# Patient Record
Sex: Female | Born: 1960 | Race: White | Hispanic: No | Marital: Married | Smoking: Never smoker
Health system: Southern US, Community
[De-identification: ages and names within clinical notes are randomized; demographics above are authoritative.]

## PROBLEM LIST (undated history)

## (undated) DIAGNOSIS — B009 Herpesviral infection, unspecified: Secondary | ICD-10-CM

## (undated) DIAGNOSIS — R51 Headache: Secondary | ICD-10-CM

## (undated) HISTORY — PX: TONSILLECTOMY: SUR1361

## (undated) HISTORY — PX: SALPINGECTOMY: SHX328

## (undated) HISTORY — DX: Headache: R51

## (undated) HISTORY — DX: Herpesviral infection, unspecified: B00.9

---

## 2001-08-16 ENCOUNTER — Other Ambulatory Visit: Admission: RE | Admit: 2001-08-16 | Discharge: 2001-08-16 | Payer: Self-pay | Admitting: Obstetrics and Gynecology

## 2002-08-28 ENCOUNTER — Encounter: Admission: RE | Admit: 2002-08-28 | Discharge: 2002-08-28 | Payer: Self-pay | Admitting: Obstetrics and Gynecology

## 2002-08-28 ENCOUNTER — Encounter: Payer: Self-pay | Admitting: Obstetrics and Gynecology

## 2002-09-25 ENCOUNTER — Other Ambulatory Visit: Admission: RE | Admit: 2002-09-25 | Discharge: 2002-09-25 | Payer: Self-pay | Admitting: Obstetrics and Gynecology

## 2003-10-02 ENCOUNTER — Other Ambulatory Visit: Admission: RE | Admit: 2003-10-02 | Discharge: 2003-10-02 | Payer: Self-pay | Admitting: Obstetrics and Gynecology

## 2004-02-06 ENCOUNTER — Encounter: Admission: RE | Admit: 2004-02-06 | Discharge: 2004-02-06 | Payer: Self-pay | Admitting: Obstetrics and Gynecology

## 2005-03-25 ENCOUNTER — Encounter: Admission: RE | Admit: 2005-03-25 | Discharge: 2005-03-25 | Payer: Self-pay | Admitting: Obstetrics and Gynecology

## 2006-04-21 ENCOUNTER — Ambulatory Visit: Payer: Self-pay | Admitting: Internal Medicine

## 2006-06-07 HISTORY — PX: ABDOMINAL SURGERY: SHX537

## 2006-12-12 ENCOUNTER — Encounter: Payer: Self-pay | Admitting: Internal Medicine

## 2007-01-07 ENCOUNTER — Encounter: Payer: Self-pay | Admitting: Internal Medicine

## 2007-01-24 ENCOUNTER — Ambulatory Visit: Payer: Self-pay | Admitting: Internal Medicine

## 2007-01-24 DIAGNOSIS — M545 Low back pain, unspecified: Secondary | ICD-10-CM | POA: Insufficient documentation

## 2007-01-24 DIAGNOSIS — T8089XA Other complications following infusion, transfusion and therapeutic injection, initial encounter: Secondary | ICD-10-CM | POA: Insufficient documentation

## 2007-01-24 DIAGNOSIS — A4189 Other specified sepsis: Secondary | ICD-10-CM | POA: Insufficient documentation

## 2007-03-09 ENCOUNTER — Encounter: Admission: RE | Admit: 2007-03-09 | Discharge: 2007-03-09 | Payer: Self-pay | Admitting: Internal Medicine

## 2007-03-09 ENCOUNTER — Encounter: Payer: Self-pay | Admitting: Internal Medicine

## 2007-03-09 ENCOUNTER — Telehealth: Payer: Self-pay | Admitting: Internal Medicine

## 2007-03-10 ENCOUNTER — Ambulatory Visit: Payer: Self-pay | Admitting: Internal Medicine

## 2007-03-10 ENCOUNTER — Encounter (INDEPENDENT_AMBULATORY_CARE_PROVIDER_SITE_OTHER): Payer: Self-pay | Admitting: *Deleted

## 2007-03-10 DIAGNOSIS — R109 Unspecified abdominal pain: Secondary | ICD-10-CM | POA: Insufficient documentation

## 2007-03-10 DIAGNOSIS — L02219 Cutaneous abscess of trunk, unspecified: Secondary | ICD-10-CM | POA: Insufficient documentation

## 2007-03-10 DIAGNOSIS — L03319 Cellulitis of trunk, unspecified: Secondary | ICD-10-CM

## 2007-03-10 LAB — CONVERTED CEMR LAB
Basophils Absolute: 0.1 10*3/uL (ref 0.0–0.1)
Basophils Relative: 2.1 % — ABNORMAL HIGH (ref 0.0–1.0)
Bilirubin Urine: NEGATIVE
Eosinophils Absolute: 0.1 10*3/uL (ref 0.0–0.6)
Eosinophils Relative: 2 % (ref 0.0–5.0)
HCT: 36.3 % (ref 36.0–46.0)
Hemoglobin, Urine: NEGATIVE
Hemoglobin: 12.4 g/dL (ref 12.0–15.0)
Ketones, ur: NEGATIVE mg/dL
Leukocytes, UA: NEGATIVE
Lymphocytes Relative: 22.5 % (ref 12.0–46.0)
MCHC: 34.3 g/dL (ref 30.0–36.0)
MCV: 90.9 fL (ref 78.0–100.0)
Monocytes Absolute: 0.4 10*3/uL (ref 0.2–0.7)
Monocytes Relative: 6.1 % (ref 3.0–11.0)
Neutro Abs: 4.6 10*3/uL (ref 1.4–7.7)
Neutrophils Relative %: 67.3 % (ref 43.0–77.0)
Nitrite: NEGATIVE
Platelets: 249 10*3/uL (ref 150–400)
RBC: 3.99 M/uL (ref 3.87–5.11)
RDW: 14.2 % (ref 11.5–14.6)
Specific Gravity, Urine: >=1.03 (ref 1.000–1.03)
Total Protein, Urine: NEGATIVE mg/dL
Urine Glucose: NEGATIVE mg/dL
Urobilinogen, UA: 0.2 (ref 0.0–1.0)
WBC: 6.7 10*3/uL (ref 4.5–10.5)
pH: 5.5 (ref 5.0–8.0)

## 2007-05-01 ENCOUNTER — Telehealth: Payer: Self-pay | Admitting: Internal Medicine

## 2007-05-02 ENCOUNTER — Ambulatory Visit: Payer: Self-pay | Admitting: Internal Medicine

## 2007-05-08 ENCOUNTER — Encounter (INDEPENDENT_AMBULATORY_CARE_PROVIDER_SITE_OTHER): Payer: Self-pay | Admitting: *Deleted

## 2007-05-08 LAB — CONVERTED CEMR LAB
T3, Free: 2.5 pg/mL (ref 2.3–4.2)
TSH: 3.98 microintl units/mL (ref 0.35–5.50)

## 2007-05-09 ENCOUNTER — Encounter (INDEPENDENT_AMBULATORY_CARE_PROVIDER_SITE_OTHER): Payer: Self-pay | Admitting: *Deleted

## 2007-05-11 ENCOUNTER — Telehealth (INDEPENDENT_AMBULATORY_CARE_PROVIDER_SITE_OTHER): Payer: Self-pay | Admitting: *Deleted

## 2007-10-02 ENCOUNTER — Telehealth (INDEPENDENT_AMBULATORY_CARE_PROVIDER_SITE_OTHER): Payer: Self-pay | Admitting: *Deleted

## 2007-10-03 ENCOUNTER — Ambulatory Visit: Payer: Self-pay | Admitting: Internal Medicine

## 2007-10-03 DIAGNOSIS — F329 Major depressive disorder, single episode, unspecified: Secondary | ICD-10-CM | POA: Insufficient documentation

## 2007-10-03 DIAGNOSIS — F3289 Other specified depressive episodes: Secondary | ICD-10-CM | POA: Insufficient documentation

## 2007-10-09 LAB — CONVERTED CEMR LAB
Free T4: 0.8 ng/dL (ref 0.6–1.6)
TSH: 2.65 microintl units/mL (ref 0.35–5.50)

## 2007-10-24 ENCOUNTER — Telehealth (INDEPENDENT_AMBULATORY_CARE_PROVIDER_SITE_OTHER): Payer: Self-pay | Admitting: *Deleted

## 2007-10-24 ENCOUNTER — Encounter: Payer: Self-pay | Admitting: Internal Medicine

## 2007-12-13 ENCOUNTER — Encounter: Payer: Self-pay | Admitting: Internal Medicine

## 2009-07-15 ENCOUNTER — Ambulatory Visit: Payer: Self-pay | Admitting: Sports Medicine

## 2009-07-15 DIAGNOSIS — M25559 Pain in unspecified hip: Secondary | ICD-10-CM | POA: Insufficient documentation

## 2009-07-15 DIAGNOSIS — M21869 Other specified acquired deformities of unspecified lower leg: Secondary | ICD-10-CM | POA: Insufficient documentation

## 2009-07-15 DIAGNOSIS — M7122 Synovial cyst of popliteal space [Baker], left knee: Secondary | ICD-10-CM | POA: Insufficient documentation

## 2009-09-14 IMAGING — CR DG CHEST 2V
2 series · 2 of 2 positions shown · non-contrast
Comparison: none

CLINICAL DATA: Pneumonia

Chest 2 view:
No previous available for comparison. The heart size and mediastinal contours
are within normal limits.  Both lungs are clear.  The visualized skeletal
structures are unremarkable.

[view not recorded (1 of 2)]
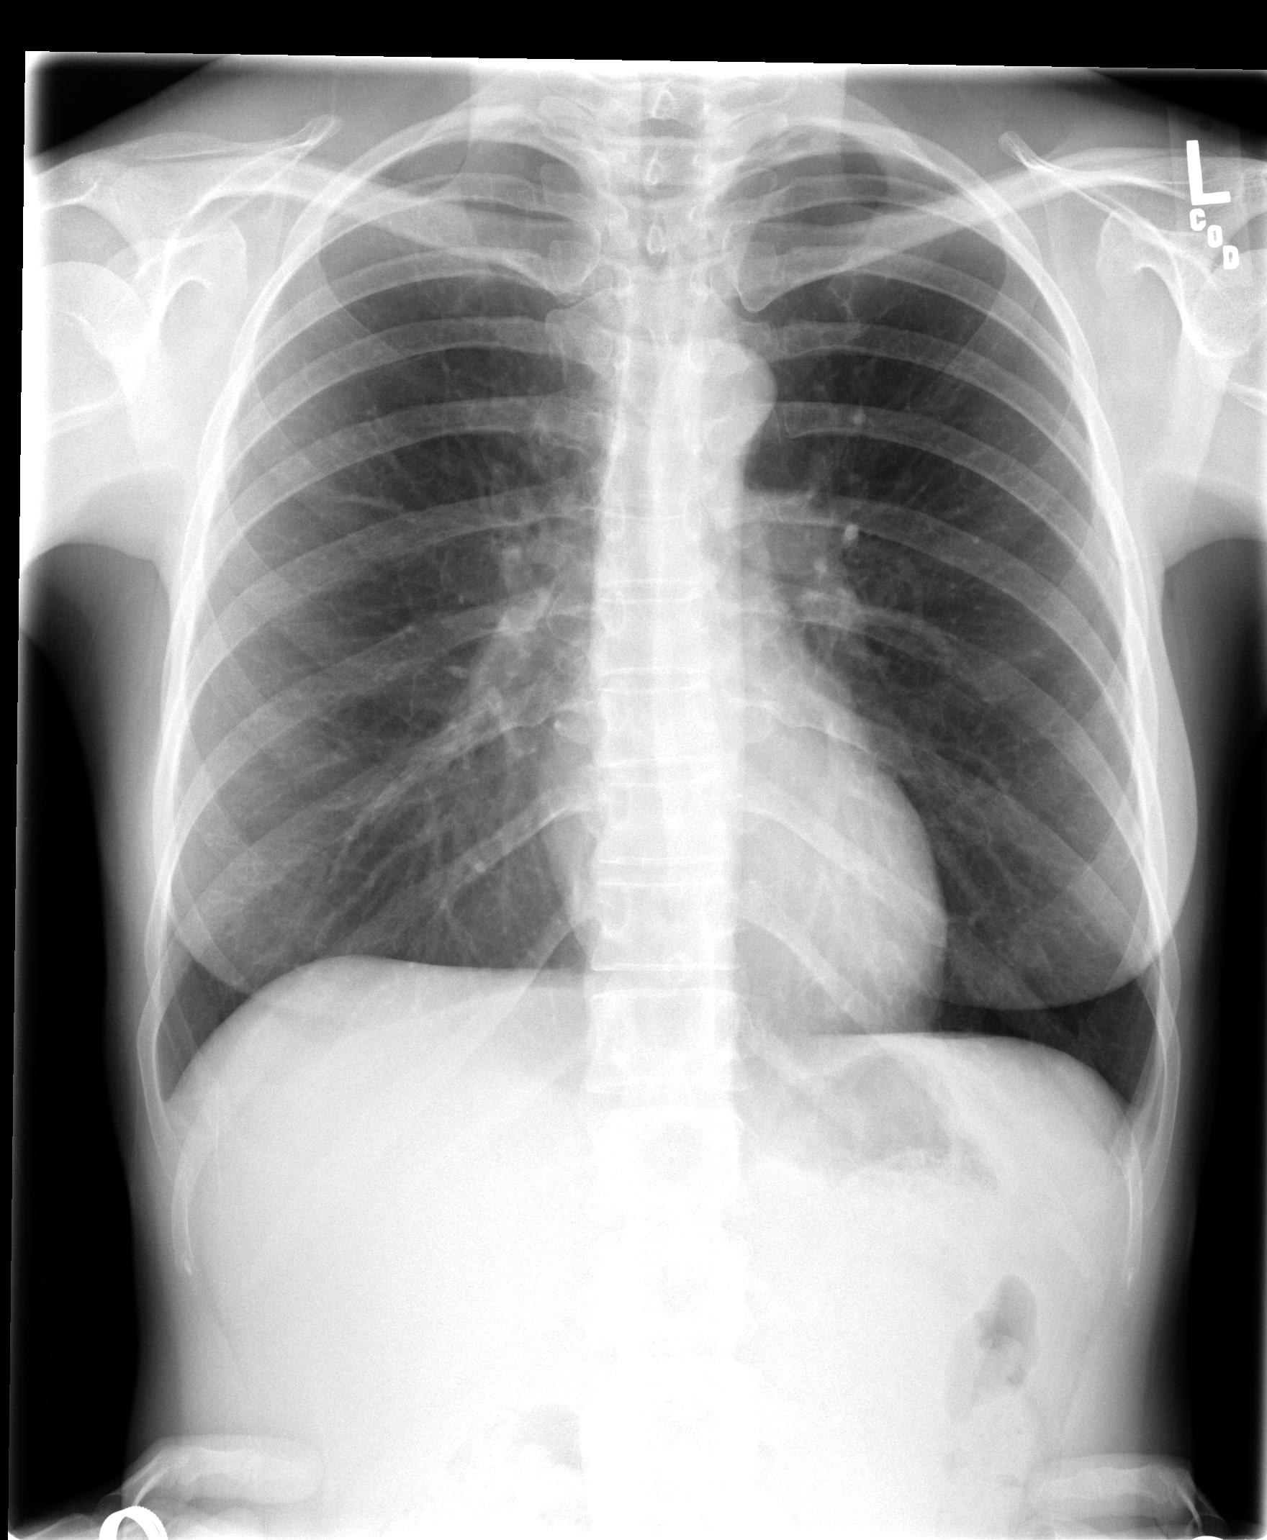

[view not recorded (2 of 2)]
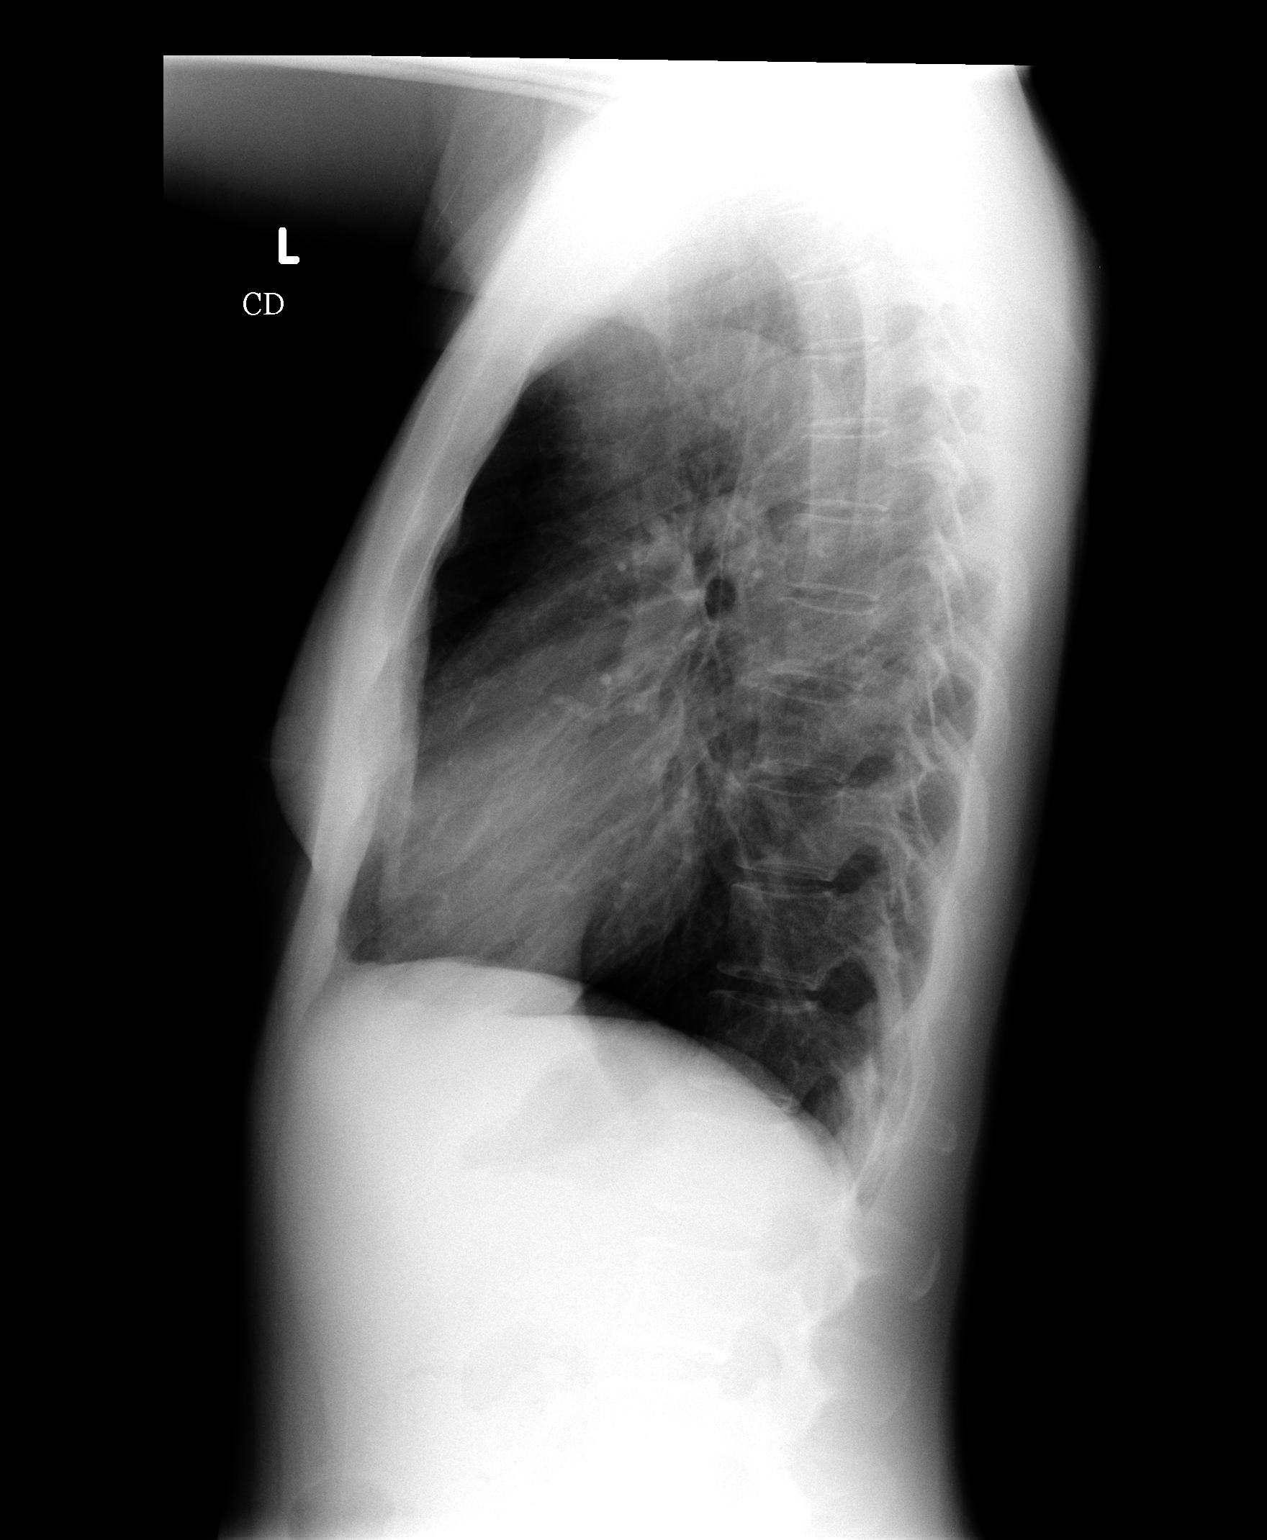

[2 of 2 positions shown; findings below may reference images not displayed]

IMPRESSION: 1. No active cardiopulmonary disease.

## 2009-09-14 IMAGING — CT CT ABDOMEN W/ CM
2 of 3 series · 16 of 46 positions shown, 18 images · IV contrast (DELAYED & DELAYED)
Comparison: None.

CLINICAL DATA: 46-year-old, periumbilical abdominal pain and swelling for 3 days.  History of septicemia, peritonitis, multiple abscesses in January 2007, and fallopian tubes resected. 
 ABDOMEN CT WITH CONTRAST:
TECHNIQUE: Multidetector CT imaging of the abdomen was performed following the standard protocol during bolus administration of intravenous contrast.
 Contrast:  100 cc Omnipaque 300
TECHNIQUE: Multidetector CT imaging of the pelvis was performed following the standard protocol during bolus administration of intravenous contrast.

[Series 2: delayed abd · axial · 0.62mm/px · z∈[-237,-27]mm · 13 of 50 slices shown, 15 images]
[im 4/50  soft-tissue]
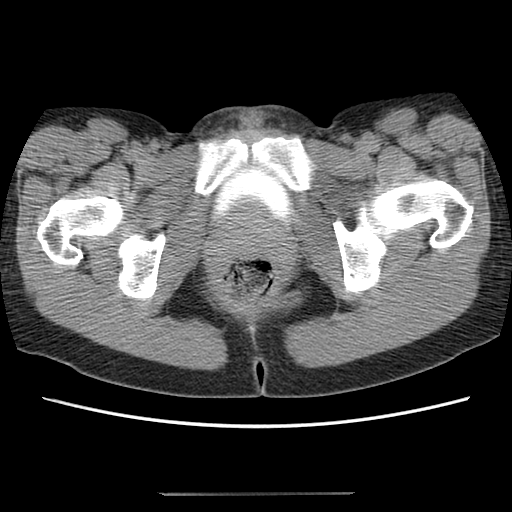
[im 4/50  bone]
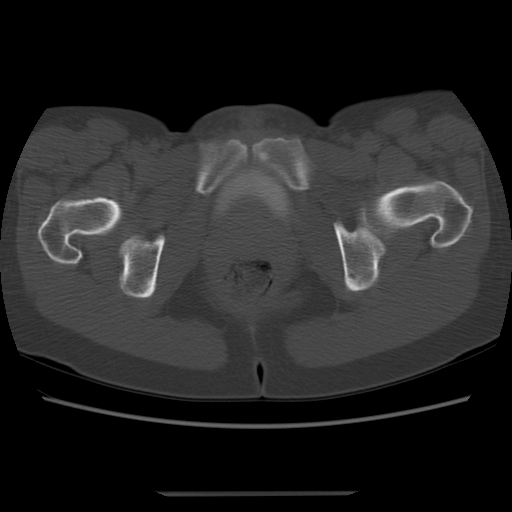
[im 7/50  soft-tissue]
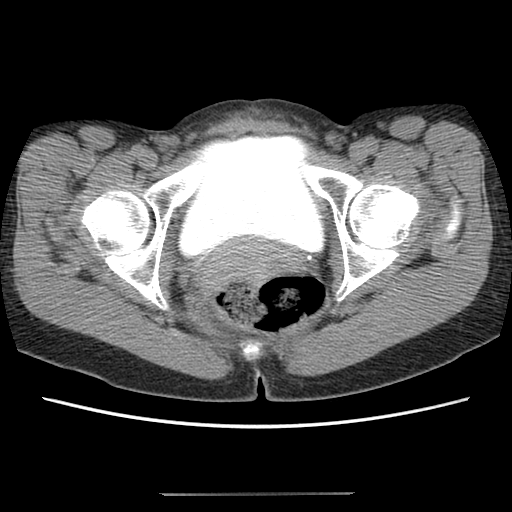
[im 10/50  soft-tissue]
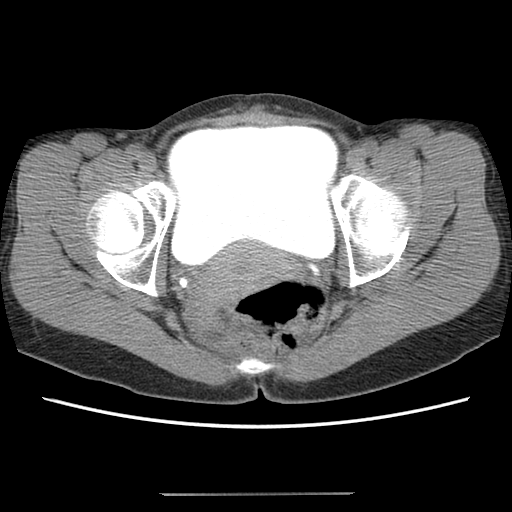
[im 15/50  soft-tissue]
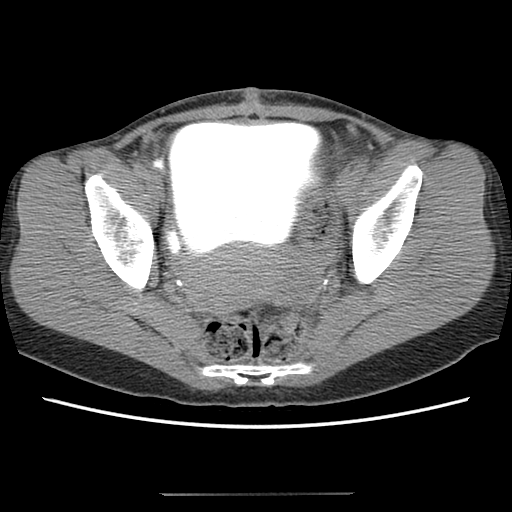
[im 18/50  soft-tissue]
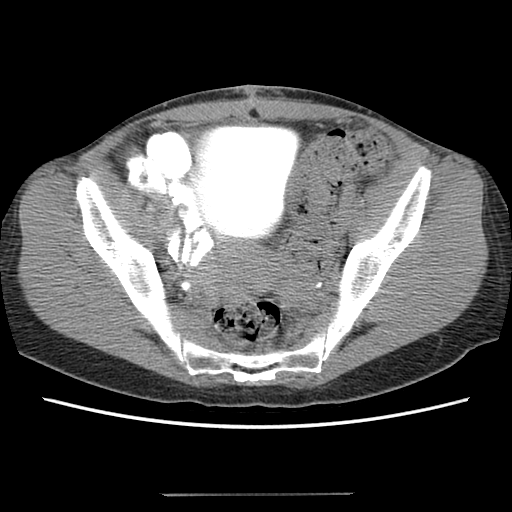
[im 21/50  soft-tissue]
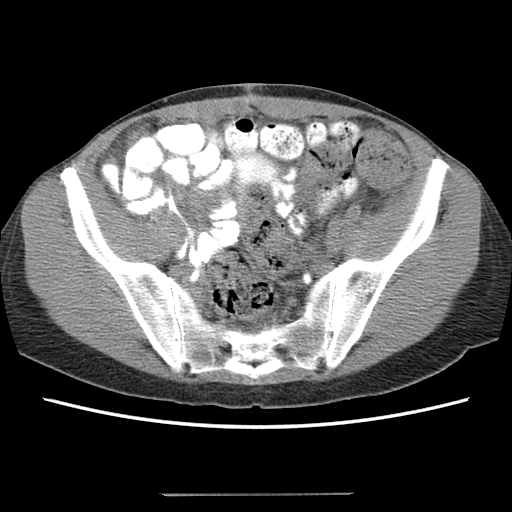
[im 26/50  soft-tissue]
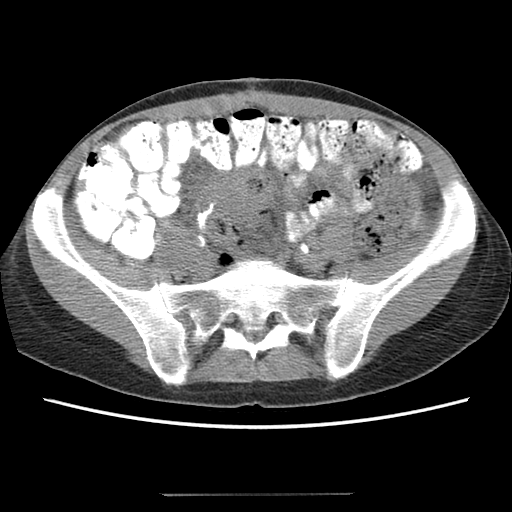
[im 29/50  soft-tissue]
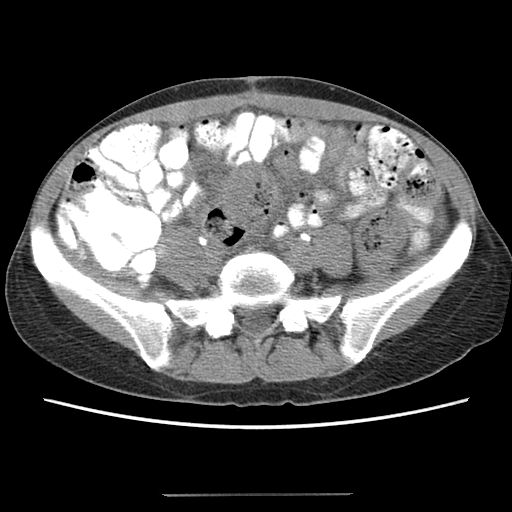
[im 32/50  soft-tissue]
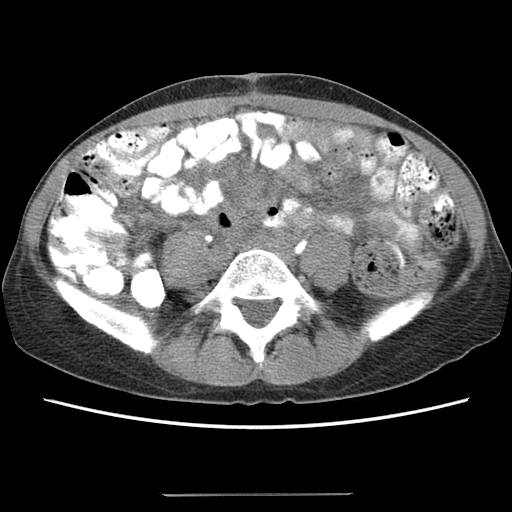
[im 32/50  bone]
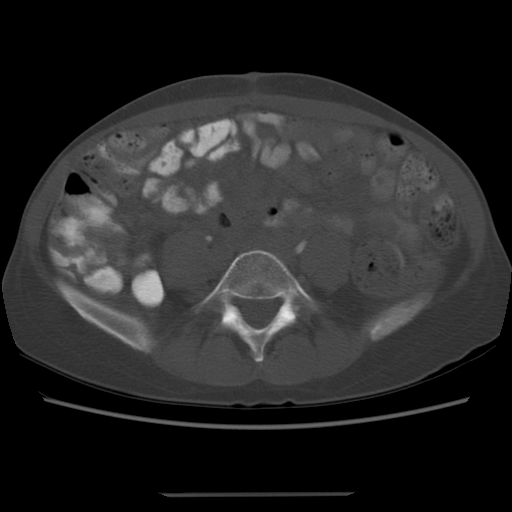
[im 35/50  soft-tissue]
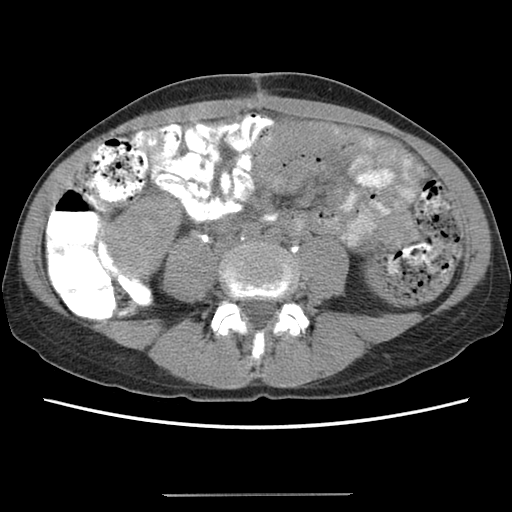
[im 40/50  soft-tissue]
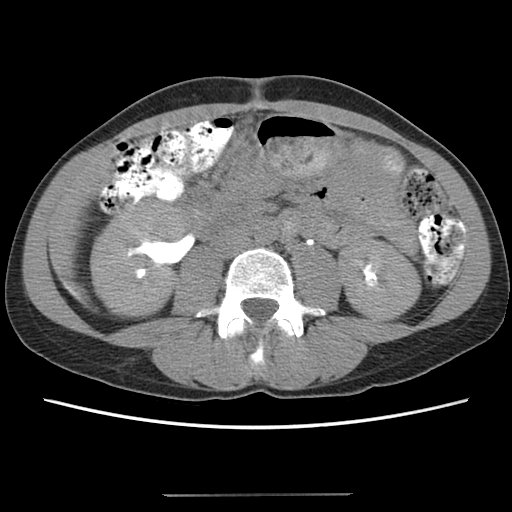
[im 43/50  soft-tissue]
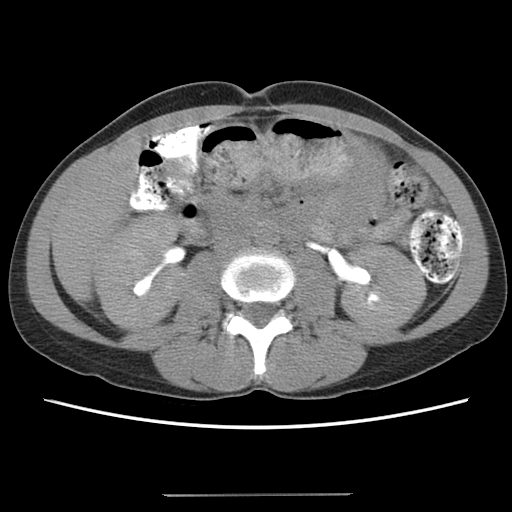
[im 46/50  soft-tissue]
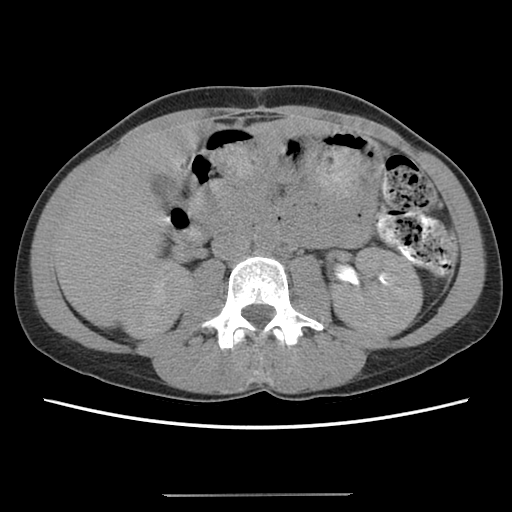

[Series 400: cor delayed abd · coronal · 0.62mm/px · 3 of 85 slices shown]
[im 29/85  soft-tissue]
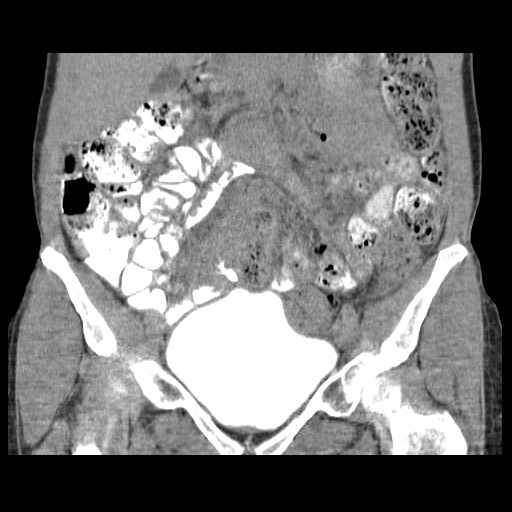
[im 38/85  soft-tissue]
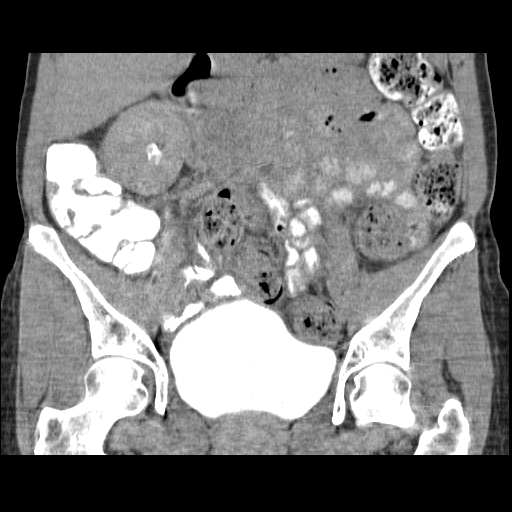
[im 47/85  soft-tissue]
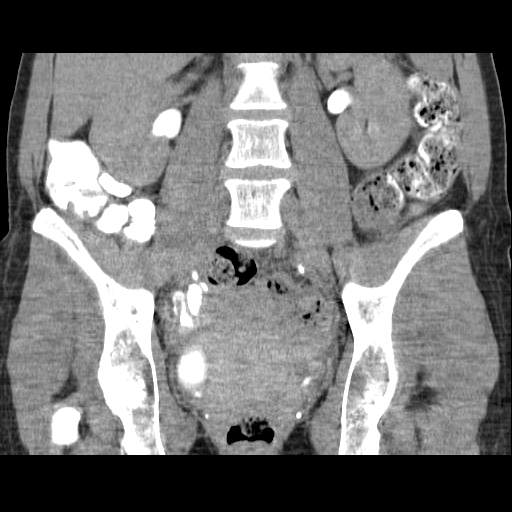

[16 of 46 positions shown; findings below may reference images not displayed]

FINDINGS: Heart size is normal.  Lung bases are clear.  
 The liver demonstrates no significant findings.  No biliary dilatation or focal lesions.  The gallbladder appears normal.  There are multiple small low attenuation lesions in the spleen.  It is possible these represent small hemangiomas but I think it is unlikely.  I would recommend correlating with any prior imaging this patient had done to see if this is a stable finding.  The pancreas is unremarkable.  The adrenal glands and kidneys are normal.  The aorta is normal in caliber.  Major branch vessels are normal.   The portal and splenic veins are patent.  Stomach is fairly well distended and demonstrates no gross abnormalities.  The duodenum, small bowel, and colon are grossly normal.  There is moderate to marked constipation noted.  No enlarged mesenteric or retroperitoneal lymph nodes.  
 No significant bony findings.
IMPRESSION: 1.  No acute abdominal findings, mass lesions, or adenopathy.  There are multiple low attenuation lesions in the spleen.  These could be small hemangiomas, splenic cysts, or other benign lesions but I would recommend correlating with the patient?s prior imaging studies to make sure these are stable and unchanged.  
 2.  Moderate constipation. 
 PELVIS CT WITH CONTRAST:
FINDINGS: On the initial scan there were some unopacified bowel loops and the patient has very little intraperitoneal fat.   I did some delayed images and the small bowel loops were opacified much better.  There is moderate constipation involving the rectum and the sigmoid colon.  I do not see any findings to suggest abdominal abscess.  On the delayed images there are some contracted appearing small bowel loops but no definite mass or abscess.  The bladder is unremarkable.  There is a cystic structure associated with the left ovary which may be a benign cyst.   The uterus is abnormal.  The endometrial cavity appears markedly expanded.  I cannot exclude a process such as endometritis.  Pelvic ultrasound may be helpful for further evaluation of these findings particularly if the patient has had any pelvic pain.  The right ovary is grossly normal.  I do not see the appendix for certain.  No significant bony findings.
IMPRESSION: Expanded appearing endometrial canal.  I cannot exclude a process such as endometritis or endometrial hyperplasia.  Pelvic sonogram may be helpful for further evaluation.  There is also a cystic area associated with the left ovary which may be better evaluated with ultrasound.  No obvious pelvic abscess.

## 2010-07-07 NOTE — Assessment & Plan Note (Signed)
Summary: NP,R KNEE/HIP PAIN AFTER RUNNING,MC   Vital Signs:  Patient profile:   50 year old female Height:      64 inches Weight:      116 pounds BMI:     19.98 BP sitting:   113 / 71  Vitals Entered By: Lillia Pauls CMA (July 15, 2009 10:21 AM)  History of Present Illness: Pt presents as a new patient for right knee pain that has been present since June 2010 when she started running with the [employer]. She almost immediately started having the knee pain when she started her running program. She is a very fit individual who has worked with a Systems analyst in the past and done well with yoga. She has never been a runner beforehand. Her pain typically starts about 10-15 minutes into her run on the lateral portion of her right knee. She cannot find a focal location of the pain but it radiates around her patella. She pain typically gets better with rest. She was able to complete the 5K race but did limp after about a mile. She denies locking or swelling. She also often feels a popping sensation of her right hip and has seen a chiropracter for the past 4 months for SI joint pain. She has now been resting for one month adn is still having some difficulty any time she tries to run again. She has not tried any medications or ice/heat.  Allergies: 1)  ! * Zosyn  Physical Exam  General:  alert and well-developed.   Head:  normocephalic and atraumatic.   Neck:  supple.   Lungs:  normal respiratory effort.   Msk:  Right Knee: No bony abnormalities, edmea or bruising. No TTP along the joint line, bursas or patellar tendon Full flexion and hyperextension Very loose joint capsule Laxity of LCL testing greater than laxity of MCL testing Loose Lachman's and anterior drawer sign but good end point Neg posterior drawer sign and Neg McMurray's 5/5 strength with resisted knee flexion and extension 5/5 hip abduction and flexor strength Neg patellar grind and aprehension  testing  Left Knee: No bony abnormalities, edmea or bruising. No TTP along the joint line, bursas or patellar tendon Full flexion and hyperextension Very loose joint capsule Laxity of LCL testing greater than laxity of MCL testing Loose Lachman's and anterior drawer sign but good end point Neg posterior drawer sign and Neg McMurray's 5/5 strength with resisted knee flexion and extension 5/5 hip abduction and flexor strength Neg patellar grind and aprehension testing  Equal leg lengths Normal hip internal and external rotation Ankles also have increased laxity but normal finger, wrist and elbow rigidity  Gait: Good running and walking gait in the office - suspect fatigue with prolonged running and more lateral movement of knee and hips then   Impression & Recommendations:  Problem # 1:  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 Mainstay of treatment is to tighten her joints (hips and knees) as much as possible to keep the joint tight with decreased lateral mobility during running 2. Given multiple hip and knee strengthening exercises (she already has very good strength but the only treatment is to strengthen her even more 3. Will retest strength in 6 weeks at follow-up 4. Can use OTC meds as needed for pain  Problem # 2:  HIP PAIN (ICD-719.45) Likley due to lateral motion of knee and hip during running when fatigued 1. Strengthening  exercises 2. Also having a right hamstring problem so given hamstring rehab exercises to do daily  Problem # 3:  KNEE PAIN (ICD-719.46) Due to her genu reurvatum 1. See above treatment plan  Complete Medication List: 1)  Multivitamins Tabs (Multiple vitamin) .... Take 1 tablet by mouth daily 2)  Budeprion Xl 150 Mg Tb24 (Bupropion hcl) .Marland Kitchen.. 1 once daily x 7 then 2 qd 3)  Claritin 10 Mg Caps (Loratadine) .Marland Kitchen.. 1 once daily prn 4)  Citalopram Hydrobromide 20 Mg Tabs  (Citalopram hydrobromide) .... Take one tablet daily.  Patient Instructions: 1)  Do three sets of 10 of high impact lunges.  2)  Do the hip and hamstring exercises daily that are starred on your paper. Do not do any extreme flexion or hyperextension during the exercises.  3)  We will see you back in 6 weeks for a follow-up.

## 2010-10-23 NOTE — Assessment & Plan Note (Signed)
[facility]                        [facility] OFFICE NOTE   NAME:[name], [name]                         MRN:          [id number]  DATE:04/21/2006                            DOB:          March 01, 1961    Bianca [name] was seen on April 21, 2006 with mid-chest pressure,  present for 2 weeks.  This was in the context of increased stress and  she attributed her symptoms to anxiety.  She is physically active at a  high level 3 to 5 days per weekemploying a treadmill or elliptical for  20 minutes or more with no cardiopulmonary symptoms.   She does have a history of anxiety issues in the past for which she saw  Bradley Ferris.  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  She is scheduled to see Colen Darling sometime in the near  future.   Her father had depression and a sister is on Zoloft.   She did have bronchitis lasting 2 weeks prior to the office visit.   She describes the symptoms as pressure across the mid-chest and is  nonexertional. There is no associated diaphoresis or nausea.  The  symptoms have not occurred with the exercise and her exercise has not  been limited by symptoms.   She is on birth control and vitamins.   She has no known drug allergies.   She drinks socially.  Is a nonsmoker.   She has not been hospitalized except for a tonsillectomy at age 26.   FAMILY HISTORY:  Is positive for coronary artery disease, stroke and  diabetes.   REVIEW OF SYSTEMS:  Revealed intermittent nausea and some dizziness.  She has had no melena or dysphagia and no constitutional symptoms.   Weight was 119, pulse 60 and regular.  Respiratory rate 16 and blood  pressure 102/66.   Thyroid was slightly asymmetric with the right lobe greater than the  left.  Cardiopulmonary exam was unremarkable except for a grade I systolic  murmur.  Homan's sign was negative.  All pulses were intact.  There was no edema.  She had no  organomegaly or masses or lymphadenopathy.  She is appropriate and intelligent.   EKG was normal.  There was a left axis deviation.  Troponin was 0.01.  CPK and CPK-MD were ordered but inadvertently not done.   The mood disorder questionnaire was negative for bipolar disorder.   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   The chest pain was attributed to esophageal reflux and a PPI was  recommended. As of the day of this dictation, I have not heard from [name].  To facilitate continuity of care, I will send a copy of this to  [employer].     Titus Dubin. Alwyn Ren, MD,FACP,FCCP  Electronically Signed    [facility]  DD: 07/20/2006  DT: 07/20/2006  Job #: [id number]

## 2011-10-19 ENCOUNTER — Telehealth: Payer: Self-pay | Admitting: Internal Medicine

## 2011-10-19 NOTE — Telephone Encounter (Signed)
Left message on voicemail informing patient of JP (Dr.Paz)  response

## 2011-10-19 NOTE — Telephone Encounter (Signed)
Pt called and is req to change pcp from Dr Alwyn Ren at [facility] to nurse practitioner Adline Mango at [facility], because it is closer to pts home. Pls advise if ok.

## 2011-10-19 NOTE — Telephone Encounter (Signed)
I don't think Alfonse Flavors will have a problem JP

## 2011-10-20 ENCOUNTER — Ambulatory Visit (INDEPENDENT_AMBULATORY_CARE_PROVIDER_SITE_OTHER): Payer: BC Managed Care – PPO | Admitting: Family

## 2011-10-20 ENCOUNTER — Encounter: Payer: Self-pay | Admitting: Family

## 2011-10-20 DIAGNOSIS — R51 Headache: Secondary | ICD-10-CM

## 2011-10-20 DIAGNOSIS — R079 Chest pain, unspecified: Secondary | ICD-10-CM

## 2011-10-20 LAB — BASIC METABOLIC PANEL WITH GFR
BUN: 16 mg/dL (ref 6–23)
CO2: 28 meq/L (ref 19–32)
Calcium: 9.6 mg/dL (ref 8.4–10.5)
Chloride: 99 meq/L (ref 96–112)
Creatinine, Ser: 0.7 mg/dL (ref 0.4–1.2)
GFR: 91.42 mL/min
Glucose, Bld: 89 mg/dL (ref 70–99)
Potassium: 4 meq/L (ref 3.5–5.1)
Sodium: 136 meq/L (ref 135–145)

## 2011-10-20 LAB — CBC WITH DIFFERENTIAL/PLATELET
Basophils Absolute: 0 10*3/uL (ref 0.0–0.1)
Basophils Relative: 0.8 % (ref 0.0–3.0)
Eosinophils Absolute: 0.1 10*3/uL (ref 0.0–0.7)
Eosinophils Relative: 1.5 % (ref 0.0–5.0)
HCT: 40.7 % (ref 36.0–46.0)
Hemoglobin: 13.7 g/dL (ref 12.0–15.0)
Lymphocytes Relative: 37.6 % (ref 12.0–46.0)
Lymphs Abs: 2.2 10*3/uL (ref 0.7–4.0)
MCHC: 33.8 g/dL (ref 30.0–36.0)
MCV: 91.1 fl (ref 78.0–100.0)
Monocytes Absolute: 0.4 10*3/uL (ref 0.1–1.0)
Monocytes Relative: 6.6 % (ref 3.0–12.0)
Neutro Abs: 3.1 10*3/uL (ref 1.4–7.7)
Neutrophils Relative %: 53.5 % (ref 43.0–77.0)
Platelets: 234 10*3/uL (ref 150.0–400.0)
RBC: 4.46 Mil/uL (ref 3.87–5.11)
RDW: 12.7 % (ref 11.5–14.6)
WBC: 5.9 10*3/uL (ref 4.5–10.5)

## 2011-10-20 LAB — TSH: TSH: 1.95 u[IU]/mL (ref 0.35–5.50)

## 2011-10-20 MED ORDER — VALACYCLOVIR HCL 1 G PO TABS: 1000.0000 mg | ORAL_TABLET | Freq: Three times a day (TID) | ORAL | Status: DC

## 2011-10-20 NOTE — Progress Notes (Signed)
Subjective:    Patient ID: Bianca [name], female    DOB: Dec 01, 1960, 51 y.o.   MRN: [id number]  HPI 51 year old white female, nonsmoker, patient is in with complaints of chest pain x2 weeks. Chest pain is worse in with emotional stress. She reports the pain as an ache rating it is 0-1/10 that is present more times that has not. The pain is located in the center of her chest. It is not aggravated by exertion. 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 She has a father who has a significant cardiovascular history. Her father has had myocardial infarction x5, stent placement x9. She is uncle died of a myocardial infarction at age 65. Patient denies any lightheadedness or dizziness does admit to headache, denies any shortness of breath, or palpitations, or edema.  Patient does routinely exercise. Pain is relieved with exercise.    Review of Systems  HENT: Negative.   Eyes: Negative.   Respiratory: Negative.  Negative for shortness of breath.   Cardiovascular: Positive for chest pain. Negative for palpitations and leg swelling.  Gastrointestinal: Negative.   Genitourinary: Negative.   Musculoskeletal: Negative.   Skin: Negative.   Neurological: Negative.   Hematological: Negative.   Psychiatric/Behavioral: Negative.    Past Medical History  Diagnosis Date  . Headache   . Herpes     History   Social History  . Marital Status: Single    Spouse Name: N/A    Number of Children: N/A  . Years of Education: N/A   Occupational History  . Not on file.   Social History Main Topics  . Smoking status: Never Smoker   . Smokeless tobacco: Not on file  . Alcohol Use: Yes     socially  . Drug Use: No  . Sexually Active: Not on file   Other Topics Concern  . Not on file   Social History Narrative  . No narrative on file    Past Surgical History  Procedure Date  . Tonsillectomy   . Salpingectomy     Family History  Problem Relation Age  of Onset  . Sudden death Mother     hepatitis  . Alcohol abuse Father   . Hyperlipidemia Father   . Heart disease Father   . Hyperlipidemia Paternal Grandmother   . Heart disease Paternal Grandmother   . Stroke Paternal Grandmother   . Hyperlipidemia Paternal Grandfather   . Heart disease Paternal Grandfather     Allergies  Allergen Reactions  . Piperacillin Sod-Tazobactam So     REACTION: rash and prickly sensation    No current outpatient prescriptions on file prior to visit.    BP 120/84  Ht 5\' 3"  (1.6 m)  Wt 117 lb (53.071 kg)  BMI 20.73 kg/m2chart     Objective:   Physical Exam  Constitutional: She is oriented to person, place, and time. She appears well-developed and well-nourished.  HENT:  Right Ear: External ear normal.  Left Ear: External ear normal.  Nose: Nose normal.  Mouth/Throat: Oropharynx is clear and moist.  Eyes: Conjunctivae are normal. Pupils are equal, round, and reactive to light.  Neck: Normal range of motion. Neck supple.  Cardiovascular: Normal rate, regular rhythm and normal heart sounds.   Pulmonary/Chest: Effort normal and breath sounds normal.  Musculoskeletal: Normal range of motion.  Neurological: She is alert and oriented to person, place, and time.  Skin: Skin is warm and dry.  Psychiatric: She has a normal mood  and affect.     EKG shows a right bundle branch block otherwise within normal limits no acute changes     Assessment & Plan:  Assessment: Chest pain-new problem with workup  Plan: Lab sent to include BMP, CBC, TSH Will notify patient pending results. Due to her significant cardiovascular history minor abnormalities on her EKG, were for her to cardiology for further management. Advised to complete physical exam with fasting labs to assess her cholesterol, and further cardiovascular risk.

## 2011-10-21 ENCOUNTER — Telehealth: Payer: Self-pay | Admitting: Speech Pathology

## 2011-10-21 ENCOUNTER — Telehealth: Payer: Self-pay

## 2011-10-21 NOTE — Telephone Encounter (Signed)
Message copied by Beverely Low on Thu Oct 21, 2011  3:15 PM ------      Message from: Adline Mango B      Created: Thu Oct 21, 2011  8:29 AM       Labs normal

## 2011-10-21 NOTE — Telephone Encounter (Signed)
Pt called and was asking why she was referred to a Cardiologist. She said she was told her EKG was fine and there was nothing to worry about. She is requesting you leave a detailed message on her cell phone.

## 2011-10-21 NOTE — Telephone Encounter (Signed)
Left message on personally identified voicemail to notify pt that per Padonda, as discussed at pt's OV, she has been referred to cardiology primarily due to her family hx of heart disease, stroke, and sudden death. Advised to call back with further questions or concerns

## 2011-10-21 NOTE — Telephone Encounter (Signed)
Spoke with pt concerning Cardiology referral. Pt declines cardio appt, stating that she in better shape than anyone she knows and does not want to go to a cardiologist just for the sake of going. I advised pt that cardio is definitely recommended by Padonda, however, we cannot force her to go. I also let pt know that if Oran Rein has any concerns she will give her a call.  Oran Rein is aware of pt decision

## 2011-11-15 ENCOUNTER — Institutional Professional Consult (permissible substitution): Payer: BC Managed Care – PPO | Admitting: Cardiology

## 2011-11-17 ENCOUNTER — Encounter: Payer: Self-pay | Admitting: Family

## 2011-11-17 ENCOUNTER — Other Ambulatory Visit (HOSPITAL_COMMUNITY)
Admission: RE | Admit: 2011-11-17 | Discharge: 2011-11-17 | Disposition: A | Discharge status: Home / Self Care | Payer: BC Managed Care – PPO | Source: Ambulatory Visit | Attending: Family | Admitting: Family

## 2011-11-17 ENCOUNTER — Ambulatory Visit (INDEPENDENT_AMBULATORY_CARE_PROVIDER_SITE_OTHER): Payer: BC Managed Care – PPO | Admitting: Family

## 2011-11-17 VITALS — BP 116/80 | HR 83 | Temp 98.3°F | Wt 115.0 lb

## 2011-11-17 DIAGNOSIS — Z124 Encounter for screening for malignant neoplasm of cervix: Secondary | ICD-10-CM

## 2011-11-17 DIAGNOSIS — Z01419 Encounter for gynecological examination (general) (routine) without abnormal findings: Secondary | ICD-10-CM | POA: Insufficient documentation

## 2011-11-17 DIAGNOSIS — K921 Melena: Secondary | ICD-10-CM

## 2011-11-17 DIAGNOSIS — Z0000 Encounter for general adult medical examination without abnormal findings: Secondary | ICD-10-CM

## 2011-11-17 DIAGNOSIS — Z1231 Encounter for screening mammogram for malignant neoplasm of breast: Secondary | ICD-10-CM

## 2011-11-17 LAB — LIPID PANEL
Cholesterol: 220 mg/dL — ABNORMAL HIGH (ref 0–200)
HDL: 89.4 mg/dL (ref >39.00)
Total CHOL/HDL Ratio: 2
Triglycerides: 34 mg/dL (ref 0.0–149.0)
VLDL: 6.8 mg/dL (ref 0.0–40.0)

## 2011-11-17 LAB — LDL CHOLESTEROL, DIRECT: Direct LDL: 125.4 mg/dL

## 2011-11-17 NOTE — Patient Instructions (Addendum)
Exercise to Stay Healthy  Exercise helps you become and stay healthy.    EXERCISE IDEAS AND TIPS  Choose exercises that:   You enjoy.   Fit into your day.  You do not need to exercise really hard to be healthy. You can do exercises at a slow or medium level and stay healthy. You can:   Stretch before and after working out.   Try yoga, Pilates, or tai chi.   Lift weights.   Walk fast, swim, jog, run, climb stairs, bicycle, dance, or rollerskate.   Take aerobic classes.    Exercises that burn about 150 calories:     Running 1  miles in 15 minutes.   Playing volleyball for 45 to 60 minutes.   Washing and waxing a car for 45 to 60 minutes.   Playing touch football for 45 minutes.   Walking 1  miles in 35 minutes.   Pushing a stroller 1  miles in 30 minutes.   Playing basketball for 30 minutes.   Raking leaves for 30 minutes.   Bicycling 5 miles in 30 minutes.   Walking 2 miles in 30 minutes.   Dancing for 30 minutes.   Shoveling snow for 15 minutes.   Swimming laps for 20 minutes.   Walking up stairs for 15 minutes.   Bicycling 4 miles in 15 minutes.   Gardening for 30 to 45 minutes.   Jumping rope for 15 minutes.   Washing windows or floors for 45 to 60 minutes.  Document Released: 06/26/2010 Document Revised: 05/13/2011 Document Reviewed: 06/26/2010  [employer] Patient Information 2012 [employer].

## 2011-11-17 NOTE — Addendum Note (Signed)
Addended byAdline Mango B on: 11/17/2011 01:14 PM   Modules accepted: Orders

## 2011-11-17 NOTE — Progress Notes (Signed)
Subjective:    Patient ID: Bianca [name], female    DOB: 1961-01-20, 51 y.o.   MRN: [id number]  HPI  This is a routine physical examination for this healthy  Female. Reviewed all health maintenance protocols including mammography colonoscopy bone density and reviewed appropriate screening labs. Her immunization history was reviewed as well as her current medications and allergies refills of her chronic medications were given and the plan for yearly health maintenance was discussed all orders and referrals were made as appropriate.   Review of Systems  Constitutional: Negative.   HENT: Negative.   Eyes: Negative.   Respiratory: Negative.   Cardiovascular: Negative.   Gastrointestinal: Negative.   Genitourinary: Negative.   Musculoskeletal: Negative.   Skin: Negative.   Neurological: Negative.   Hematological: Negative.   Psychiatric/Behavioral: Negative.    Past Medical History  Diagnosis Date  . Headache   . Herpes     History   Social History  . Marital Status: Single    Spouse Name: N/A    Number of Children: N/A  . Years of Education: N/A   Occupational History  . Not on file.   Social History Main Topics  . Smoking status: Never Smoker   . Smokeless tobacco: Not on file  . Alcohol Use: Yes     socially  . Drug Use: No  . Sexually Active: Not on file   Other Topics Concern  . Not on file   Social History Narrative  . No narrative on file    Past Surgical History  Procedure Date  . Tonsillectomy   . Salpingectomy     Family History  Problem Relation Age of Onset  . Sudden death Mother     hepatitis  . Alcohol abuse Father   . Hyperlipidemia Father   . Heart disease Father   . Hyperlipidemia Paternal Grandmother   . Heart disease Paternal Grandmother   . Stroke Paternal Grandmother   . Hyperlipidemia Paternal Grandfather   . Heart disease Paternal Grandfather     Allergies  Allergen Reactions  . Piperacillin Sod-Tazobactam So     REACTION:  rash and prickly sensation    Current Outpatient Prescriptions on File Prior to Visit  Medication Sig Dispense Refill  . valACYclovir (VALTREX) 1000 MG tablet Take 1 tablet (1,000 mg total) by mouth 3 (three) times daily.  30 tablet  5    BP 116/80  Pulse 83  Temp 98.3 F (36.8 C) (Oral)  Wt 115 lb (52.164 kg)  SpO2 96%chart    Objective:   Physical Exam  Constitutional: She is oriented to person, place, and time. She appears well-developed and well-nourished.  HENT:  Head: Normocephalic and atraumatic.  Right Ear: External ear normal.  Left Ear: External ear normal.  Nose: Nose normal.  Mouth/Throat: Oropharynx is clear and moist.  Eyes: Conjunctivae and EOM are normal. Pupils are equal, round, and reactive to light.  Neck: Normal range of motion. Neck supple. No thyromegaly present.  Cardiovascular: Normal rate, regular rhythm and intact distal pulses.  Exam reveals friction rub. Exam reveals no gallop.   No murmur heard. Pulmonary/Chest: Effort normal and breath sounds normal. Right breast exhibits no inverted nipple, no mass, no nipple discharge, no skin change and no tenderness. Left breast exhibits no inverted nipple, no mass, no nipple discharge, no skin change and no tenderness. Breasts are symmetrical.  Abdominal: Soft. Bowel sounds are normal.  Genitourinary: Vagina normal and uterus normal. Guaiac positive stool. No vaginal discharge  found.  Musculoskeletal: Normal range of motion.  Neurological: She is alert and oriented to person, place, and time. She has normal reflexes.  Skin: Skin is warm.  Psychiatric: She has a normal mood and affect.          Assessment & Plan:  Assessment: CPX, Melena  Plan: Referred to GI to evaluate melena by mouth colonoscopy screening. Refer for mammogram. Encouraged healthy diet, exercise, self breast exams we'll follow the patient in the results. Lab sent to include lipids. Will recheck patient pending labs, in one year or when  necessary.

## 2012-01-24 ENCOUNTER — Encounter: Payer: Self-pay | Admitting: Gastroenterology

## 2016-02-11 ENCOUNTER — Ambulatory Visit (INDEPENDENT_AMBULATORY_CARE_PROVIDER_SITE_OTHER): Payer: BLUE CROSS/BLUE SHIELD | Admitting: Sports Medicine

## 2016-02-11 ENCOUNTER — Encounter: Payer: Self-pay | Admitting: Sports Medicine

## 2016-02-11 VITALS — BP 110/80 | Ht 64.0 in | Wt 110.0 lb

## 2016-02-11 DIAGNOSIS — M7701 Medial epicondylitis, right elbow: Secondary | ICD-10-CM

## 2016-02-11 MED ORDER — NITROGLYCERIN 0.2 MG/HR TD PT24: MEDICATED_PATCH | TRANSDERMAL | 1 refills | Status: DC

## 2016-02-11 MED ORDER — MELOXICAM 15 MG PO TABS: ORAL_TABLET | ORAL | 1 refills | Status: DC

## 2016-02-11 NOTE — Patient Instructions (Signed)

## 2016-02-12 NOTE — Progress Notes (Signed)
   Subjective:    Patient ID: Bianca [name], female    DOB: 1960-08-31, 55 y.o.   MRN: [id number]  HPI chief complaint: Right elbow pain  Very pleasant, active right-hand-dominant 55 year old female comes in today complaining of 3 months of medial right elbow pain. No trauma that she can recall but a gradual onset of pain that began after she started playing tennis. She states that about a month after she started playing, she began to develop some medial sided elbow pain. Despite the pain she continued to play. When her pain persisted she stop playing tennis altogether one month ago but her pain has not resolved. She describes an aching discomfort that she localizes to the medial epicondyle. She is also getting some mild discomfort along the volar forearm but denies any pain at the lateral epicondyle. Her pain is most noticeable when picking up or lifting heavy objects. She has not noticed any swelling. She denies any similar problems in the past. No associated numbness or tingling. She has not tried any medications. She denies any nighttime pain but does get some achy discomfort in the medial elbow first thing in the morning. She bottle a counterforce strap but has been wearing incorrectly. She has also been using ice which has helped somewhat. She is not noticed any weakness. No prior elbow surgeries.  Past medical history is reviewed Medications reviewed Allergies reviewed    Review of Systems    as above Objective:   Physical Exam  Well-developed, fit appearing. No acute distress. Vital signs reviewed  Right elbow: Full range of motion. No effusion. No soft tissue swelling. She is tender to palpation directly over the medial epicondyle  with reproducible pain with resisted wrist flexion and ulnar deviation. No ecchymosis. Negative Tinel's over the cubital tunnel. Good ligamentous stability. No tenderness to palpation over the lateral epicondyle. Good grip strength. Good radial and ulnar  pulses.  MSK ultrasound of the right elbow was performed. Limited images of the medial elbow were obtained. Common extensor tendon was well visualized in the longitudinal plane. There are some small hypoechoic changes in the deep portion of the tendon along with a small area of calcification. There is also increased neovascularity seen in this area. 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      Assessment & Plan:   Right elbow pain secondary to medial epicondylitis/common flexor tendon tendinopathy  Comprehensive treatment program to include home exercises, daily icing, topical nitroglycerin (quarter patch daily), meloxicam 15 mg daily for 7 days then as needed, and correct application of her counterforce brace when active. She will follow-up with me in 4 weeks for reevaluation and repeat ultrasound. No tennis at least until follow-up.   Of note, the patient was also complaining of some posterior right hip pain which has been present for about a year. No injury that she can recall. No groin pain. Quick evaluation of this area suggests piriformis syndrome so I've given her some simple piriformis stretches to start doing. We can evaluate this further at follow-up if needed.

## 2016-03-10 ENCOUNTER — Ambulatory Visit: Payer: BLUE CROSS/BLUE SHIELD | Admitting: Sports Medicine

## 2016-07-01 ENCOUNTER — Ambulatory Visit (INDEPENDENT_AMBULATORY_CARE_PROVIDER_SITE_OTHER): Payer: BLUE CROSS/BLUE SHIELD | Admitting: Emergency Medicine

## 2016-07-01 VITALS — BP 96/62 | HR 67 | Temp 98.3°F | Resp 16 | Ht 63.0 in | Wt 117.2 lb

## 2016-07-01 DIAGNOSIS — B009 Herpesviral infection, unspecified: Secondary | ICD-10-CM

## 2016-07-01 DIAGNOSIS — M7701 Medial epicondylitis, right elbow: Secondary | ICD-10-CM | POA: Diagnosis not present

## 2016-07-01 MED ORDER — DICLOFENAC SODIUM 1 % TD GEL: 4.0000 g | Freq: Three times a day (TID) | TRANSDERMAL | 3 refills | Status: AC | PRN

## 2016-07-01 MED ORDER — VALACYCLOVIR HCL 1 G PO TABS: 1000.0000 mg | ORAL_TABLET | Freq: Two times a day (BID) | ORAL | 10 refills | Status: DC

## 2016-07-01 NOTE — Progress Notes (Signed)
Bianca [name] 56 y.o.   Chief Complaint  Patient presents with  . Rash    buttocks and back of left leg with itching    HISTORY OF PRESENT ILLNESS: This is a 56 y.o. female complaining of itchy blisterous rash to buttocks and back of upper leg; denies vaginal or rectal rash/lesions. Also c/o recurrent pain to right inner elbow; diagnosed by sports medicine MD that it was medial epicondylitis.  Rash  Associated symptoms include joint pain (right elbow inner pain). Pertinent negatives include no fever or vomiting.     Prior to Admission medications   Medication Sig Start Date End Date Taking? Authorizing Provider  diclofenac sodium (VOLTAREN) 1 % GEL Apply 4 g topically 3 (three) times daily as needed. 07/01/16 07/06/16  Georgina Quint, MD  meloxicam [facility]) 15 MG tablet Take one tablet daily for 7 days, then take as needed Patient not taking: Reported on 07/01/2016 02/11/16   Ralene Cork, DO  nitroGLYCERIN (NITRODUR - DOSED IN MG/24 HR) 0.2 mg/hr patch Place 1/4 patch to affected area daily Patient not taking: Reported on 07/01/2016 02/11/16   Ozzie Hoyle Draper, DO  valACYclovir (VALTREX) 1000 MG tablet Take 1 tablet (1,000 mg total) by mouth 2 (two) times daily. 07/01/16   Georgina Quint, MD    Allergies  Allergen Reactions  . Piperacillin Sod-Tazobactam So     REACTION: rash and prickly sensation    Patient Active Problem List   Diagnosis Date Noted  . HIP PAIN 07/15/2009  . KNEE PAIN 07/15/2009  . 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 DEPRESSION 10/03/2007  . BACK PAIN, LUMBAR 01/24/2007    Past Medical History:  Diagnosis Date  . Headache(784.0)   . Herpes     Past Surgical History:  Procedure Laterality Date  . SALPINGECTOMY    . TONSILLECTOMY      Social History   Social History  . Marital status: Single    Spouse name: N/A  . Number of children: N/A  . Years of education: N/A   Occupational History  . Not on file.   Social History Main Topics  .  Smoking status: Never Smoker  . Smokeless tobacco: Never Used  . Alcohol use 1.2 oz/week    2 Glasses of wine per week     Comment: socially  . Drug use: No  . Sexual activity: Not on file   Other Topics Concern  . Not on file   Social History Narrative  . No narrative on file    Family History  Problem Relation Age of Onset  . Sudden death Mother     hepatitis  . Alcohol abuse Father   . Hyperlipidemia Father   . Heart disease Father   . Hyperlipidemia Paternal Grandmother   . Heart disease Paternal Grandmother   . Stroke Paternal Grandmother   . Hyperlipidemia Paternal Grandfather   . Heart disease Paternal Grandfather      Review of Systems  Constitutional: Negative.  Negative for chills and fever.  HENT: Negative.   Eyes: Negative.   Respiratory: Negative.   Cardiovascular: Negative.   Gastrointestinal: Negative for abdominal pain, nausea and vomiting.  Genitourinary: Negative for dysuria and urgency.  Musculoskeletal: Positive for joint pain (right elbow inner pain).  Skin: Positive for rash.  Neurological: Negative.   All other systems reviewed and are negative.  Vitals:   07/01/16 1540  BP: 96/62  Pulse: 67  Resp: 16  Temp: 98.3 F (36.8 C)  Physical Exam  Constitutional: She is oriented to person, place, and time. She appears well-developed and well-nourished.  HENT:  Head: Normocephalic and atraumatic.  Eyes: EOM are normal. Pupils are equal, round, and reactive to light.  Neck: Normal range of motion. Neck supple.  Cardiovascular: Normal rate and regular rhythm.   Pulmonary/Chest: Effort normal.  Abdominal: Soft. There is no tenderness.  Musculoskeletal: Normal range of motion.  Right elbow: mild tenderness to medial epicondyle; FROM, no erythema  Neurological: She is alert and oriented to person, place, and time.  Skin: Skin is warm and dry. Capillary refill takes less than 2 seconds.  +herpetic lesions in buttocks  Psychiatric: She has  a normal mood and affect. Her behavior is normal.  Vitals reviewed.    ASSESSMENT & PLAN: [name] was seen today for rash.  Diagnoses and all orders for this visit:  Herpes infection  Medial epicondylitis of elbow, right  Other orders -     valACYclovir (VALTREX) 1000 MG tablet; Take 1 tablet (1,000 mg total) by mouth 2 (two) times daily. -     diclofenac sodium (VOLTAREN) 1 % GEL; Apply 4 g topically 3 (three) times daily as needed.    Patient Instructions       IF you received an x-ray today, you will receive an invoice from [facility]. Please contact [facility] at [phone] with questions or concerns regarding your invoice.   IF you received labwork today, you will receive an invoice from [facility]. Please contact [facility] at [phone] with questions or concerns regarding your invoice.   Our billing staff will not be able to assist you with questions regarding bills from these companies.  You will be contacted with the lab results as soon as they are available. The fastest way to get your results is to activate your My Chart account. Instructions are located on the last page of this paperwork. If you have not heard from us regarding the results in 2 weeks, please contact this office.     Herpes Simplex Infection Introduction A herpes simplex infection is an infection that causes blisters or sores to develop on the skin. What are the causes? This condition is caused by the herpes simplex virus (HSV). There are two types of HSV:  HSV-1. This causes most of the blisters or sores to develop around the mouth.  HSV-2. This causes most of the blisters or sores to develop in the genital area. HSV is passed very easily from person to person (is very contagious). You can get it if you come in contact with the bodily fluids of someone who has it. Examples of body fluids are saliva and the fluid from blisters. HSV stays in your body after infection. It can  cause symptoms of the infection to return throughout your life. What increases the risk? This condition is more likely to develop in people who:  Play contact sports, especially sports with a lot of skin-to-skin contact, like wrestling or rugby.  Are in close contact with others, such as in a locker room.  Have poor hygiene.  Share protective equipment or gear. What are the signs or symptoms? Symptoms of this condition can range from mild to severe. Symptoms include:  Itching or tingling of the skin.  Blisters or sores that ooze and crust over before healing.  Fever.  Flu-like symptoms.  Irritability.  Headache.  Fatigue.  Pain around the blisters or sores. How is this diagnosed? This condition is diagnosed based on your symptoms. A  sample of the fluid from a blister may be tested to confirm the diagnosis. How is this treated? This condition may be treated with:  An antiviral medicine.  Creams or ointments to relieve burning or itching. Follow these instructions at home:  Take over-the-counter and prescription medicines only as told by your health care provider.  Do not touch, pick at, or open up the blisters or sores.  If directed, apply ice to the affected area:  Put ice in a plastic bag.  Place a towel between your skin and the bag.  Leave the ice on for 20 minutes, 2-3 times a day.  If you play an organized sport, check with your coach or league to find out the requirements to return to play. You may not be able to participate if you have blisters or sores. It is recommended that you wait to participate until no new blisters have developed for 72 hours. Older blisters or sores should be drying and crusted over. You also may also have to take antiviral medicines for at least five days before returning.  Keep all follow-up visits as told by your health care provider. This is important. How is this prevented? To keep the virus from spreading to others:  Cover  the blisters or sores, if possible, to prevent them coming into contact with others.  Wash your hands often with soap and water. If soap and water are not available, use hand sanitizer.  Do not share razors or other personal items.  Make sure sports equipment, benches, and locker room facilities are sanitized routinely.  Wash towels and workout clothes in hot water.  Do not share drinks. To help prevent another outbreak:  Use sunscreen when you are out in the sun.  Learn stress management techniques. Stress can trigger another outbreak. Contact a health care provider if:  You have a fever.  You have swollen glands in your neck.  You have difficulty eating or swallowing.  You have a burning feeling or discomfort when you urinate. This information is not intended to replace advice given to you by your health care provider. Make sure you discuss any questions you have with your health care provider. Document Released: 05/24/2005 Document Revised: 10/30/2015 Document Reviewed: 12/11/2014  2017 Elsevier      Edwina Barth, MD [facility]

## 2016-07-01 NOTE — Patient Instructions (Addendum)
IF you received an x-ray today, you will receive an invoice from [facility]. Please contact [facility] at [phone] with questions or concerns regarding your invoice.   IF you received labwork today, you will receive an invoice from [place]. Please contact [facility] at [phone] with questions or concerns regarding your invoice.   Our billing staff will not be able to assist you with questions regarding bills from these companies.  You will be contacted with the lab results as soon as they are available. The fastest way to get your results is to activate your My Chart account. Instructions are located on the last page of this paperwork. If you have not heard from [place] regarding the results in 2 weeks, please contact this office.     Herpes Simplex Infection Introduction A herpes simplex infection is an infection that causes blisters or sores to develop on the skin. What are the causes? This condition is caused by the herpes simplex virus (HSV). There are two types of HSV:  HSV-1. This causes most of the blisters or sores to develop around the mouth.  HSV-2. This causes most of the blisters or sores to develop in the genital area. HSV is passed very easily from person to person (is very contagious). You can get it if you come in contact with the bodily fluids of someone who has it. Examples of body fluids are saliva and the fluid from blisters. HSV stays in your body after infection. It can cause symptoms of the infection to return throughout your life. What increases the risk? This condition is more likely to develop in people who:  Play contact sports, especially sports with a lot of skin-to-skin contact, like wrestling or rugby.  Are in close contact with others, such as in a locker room.  Have poor hygiene.  Share protective equipment or gear. What are the signs or symptoms? Symptoms of this condition can range from mild to severe. Symptoms  include:  Itching or tingling of the skin.  Blisters or sores that ooze and crust over before healing.  Fever.  Flu-like symptoms.  Irritability.  Headache.  Fatigue.  Pain around the blisters or sores. How is this diagnosed? This condition is diagnosed based on your symptoms. A sample of the fluid from a blister may be tested to confirm the diagnosis. How is this treated? This condition may be treated with:  An antiviral medicine.  Creams or ointments to relieve burning or itching. Follow these instructions at home:  Take over-the-counter and prescription medicines only as told by your health care provider.  Do not touch, pick at, or open up the blisters or sores.  If directed, apply ice to the affected area:  Put ice in a plastic bag.  Place a towel between your skin and the bag.  Leave the ice on for 20 minutes, 2-3 times a day.  If you play an organized sport, check with your coach or league to find out the requirements to return to play. You may not be able to participate if you have blisters or sores. It is recommended that you wait to participate until no new blisters have developed for 72 hours. Older blisters or sores should be drying and crusted over. You also may also have to take antiviral medicines for at least five days before returning.  Keep all follow-up visits as told by your health care provider. This is important. How is this prevented? To keep the virus from spreading to others:  Cover the blisters or sores, if possible, to prevent them coming into contact with others.  Wash your hands often with soap and water. If soap and water are not available, use hand sanitizer.  Do not share razors or other personal items.  Make sure sports equipment, benches, and locker room facilities are sanitized routinely.  Wash towels and workout clothes in hot water.  Do not share drinks. To help prevent another outbreak:  Use sunscreen when you are out in  the sun.  Learn stress management techniques. Stress can trigger another outbreak. Contact a health care provider if:  You have a fever.  You have swollen glands in your neck.  You have difficulty eating or swallowing.  You have a burning feeling or discomfort when you urinate. This information is not intended to replace advice given to you by your health care provider. Make sure you discuss any questions you have with your health care provider. Document Released: 05/24/2005 Document Revised: 10/30/2015 Document Reviewed: 12/11/2014  2017 Elsevier

## 2017-04-01 DIAGNOSIS — M238X2 Other internal derangements of left knee: Secondary | ICD-10-CM | POA: Diagnosis not present

## 2017-04-01 DIAGNOSIS — M25562 Pain in left knee: Secondary | ICD-10-CM | POA: Diagnosis not present

## 2017-04-18 DIAGNOSIS — M25562 Pain in left knee: Secondary | ICD-10-CM | POA: Diagnosis not present

## 2017-04-18 DIAGNOSIS — M238X2 Other internal derangements of left knee: Secondary | ICD-10-CM | POA: Diagnosis not present

## 2017-08-09 ENCOUNTER — Ambulatory Visit (INDEPENDENT_AMBULATORY_CARE_PROVIDER_SITE_OTHER): Payer: BLUE CROSS/BLUE SHIELD | Admitting: Sports Medicine

## 2017-08-09 ENCOUNTER — Encounter: Payer: Self-pay | Admitting: Sports Medicine

## 2017-08-09 ENCOUNTER — Ambulatory Visit: Payer: Self-pay

## 2017-08-09 VITALS — BP 122/82 | Ht 64.0 in | Wt 110.0 lb

## 2017-08-09 DIAGNOSIS — M21869 Other specified acquired deformities of unspecified lower leg: Secondary | ICD-10-CM

## 2017-08-09 DIAGNOSIS — M7122 Synovial cyst of popliteal space [Baker], left knee: Secondary | ICD-10-CM

## 2017-08-09 DIAGNOSIS — M25562 Pain in left knee: Secondary | ICD-10-CM | POA: Diagnosis not present

## 2017-08-09 NOTE — Assessment & Plan Note (Addendum)
I suspect she has some generalized joint hypermobility  This has led to gradual increase in recurvatum and ligamentous laxity  Needs compression sleeve  xtrain on bike over next month

## 2017-08-09 NOTE — Progress Notes (Signed)
[facility] Progress Note  Subjective:  Bianca [name] is a 57 y.o. female with left knee and anterior tibia and calf pain since October. She has history of hypermobility at the knees (noted in 2011). She gets pain about 2 miles into walking so has avoided walking much. Anything with much impact brings on pain. Standing is fine. She has a sensation of stiffness and swelling but has not actually seen swelling of her left knee compared to right. Occasionally feels like her left knee locks. Ice did not help. Takes occasional aleve. Taping does help some. She thinks she may have a Baker's cyst because of reading she has done online.   ROS No giving way No visible anterior swelling No night pain  Allergies  Allergen Reactions  . Piperacillin Sod-Tazobactam So     REACTION: rash and prickly sensation    Social History   Tobacco Use  . Smoking status: Never Smoker  . Smokeless tobacco: Never Used  Substance Use Topics  . Alcohol use: Yes    Alcohol/week: 1.2 oz    Types: 2 Glasses of wine per week    Comment: socially    Objective: Blood pressure 122/82, height 5\' 4"  (1.626 m), weight 110 lb (49.9 kg). Body mass index is 18.88 kg/m. Constitutional:  HENT:  Cardiovascular: RRR, S1, S2, no m/r/g.  Pulmonary/Chest: Effort normal and breath sounds normal.  Abdominal: Soft. +BS, NT Musculoskeletal: Knee appear symmetric. No obvious effusion. Some fullness behind left knee compared to right, however. Left knee hyperextends about 15 degrees. Midline joint tenderness of bilateral knees with mild crepitus.  Provocative tests for meniscus all negative - full flexion, neg shake home, standing knee bend Neurological: AOx3, no focal deficits. Skin: Skin is warm and dry. No rash noted.  Psychiatric: Normal mood and affect.  Vitals reviewed  Ultrasound of left knee: No effusion of suprapatellar region.  Lateral meniscus with normal  Micro-calcifications in popliteus tendon   Medial meniscus normal but some bulging Posterior lateral meniscus with two linear splits Note small ganglion cyst on biceps femoris tendon Medial posterior meniscus normal Swelling around sheath of medial gastrocnemius and semi-membranosus tendons - hypoechoic change 4 cm long and 1 cm wide On transverse scan aboput 0.5 cm A/P  Impression; Consistent with Baker's cyst versus semimembranosus gastrocnemius bursitis; some degenerative meniscal change in lateral posterior corner with small ganglion cyst of biceps femoris  Ultrasound and interpretation by Sibyl ParrKarl B. Dewey Viens, MD   Assessment/Plan: No problem-specific Assessment & Plan notes found for this encounter.   Compression sleeve Stationary bike x 6 weeks Limit walking based on pain  Follow-up **.  Dani GobbleHillary Fitzgerald, MD [facility], PGY-3

## 2017-09-22 ENCOUNTER — Ambulatory Visit (INDEPENDENT_AMBULATORY_CARE_PROVIDER_SITE_OTHER): Payer: BLUE CROSS/BLUE SHIELD | Admitting: Sports Medicine

## 2017-09-22 ENCOUNTER — Encounter: Payer: Self-pay | Admitting: Sports Medicine

## 2017-09-22 VITALS — BP 104/86 | Ht 64.0 in | Wt 112.0 lb

## 2017-09-22 DIAGNOSIS — M7122 Synovial cyst of popliteal space [Baker], left knee: Secondary | ICD-10-CM

## 2017-09-22 NOTE — Progress Notes (Signed)
Subjective:    Patient ID: Bianca [name], female    DOB: 05-26-1961, 57 y.o.   MRN: [id number]   CC: Left knee pain follow up  HPI: Patient is a 57 year old female who presents today to follow-up on left knee pain.  Patient was diagnosed Baker's cyst at last office visit.  Ultrasound also showed some microcalcifications in the popliteal tendon and a small ganglion cyst on the biceps for femoris tendon. Patient reports pain has significantly improved since last office visit. She has been using the compression and continue to bike as discussed during last office visit. Swelling is much improved. Patient reports intermittent lateral knee pain but it has not bother her much. Patient also reports medial ankle pain for the past few weeks with dorsiflexion. Patient has not taken any medication for it. She denies any swelling or redness and has not had any difficulty with ambulation.  Smoking status reviewed   ROS: all other systems were reviewed and are negative other than in the HPI   Past Medical History:  Diagnosis Date  . Headache(784.0)   . Herpes     Past Surgical History:  Procedure Laterality Date  . SALPINGECTOMY    . TONSILLECTOMY      Past medical history, surgical, family, and social history reviewed and updated in the EMR as appropriate.  Objective:  BP 104/86   Ht 5\' 4"  (1.626 m)   Wt 112 lb (50.8 kg)   BMI 19.22 kg/m   Vitals and nursing note reviewed  General: NAD, pleasant, able to participate in exam Left knee Exam:  Mild swelling in the popliteal fossa, without any evidence of effusion or erythema. Mildly tender to palpation. Full range of motion. No clicking or popping in the medial or lateral joint line. Provocative tests for meniscus all negative. Palpable pulses   Ankle: No visible erythema or swelling. Range of motion is full in all directions. Strength is 5/5 in all directions. Stable lateral and medial ligaments; squeeze test and kleiger test  unremarkable; Talar dome nontender; No pain at base of 5th MT; No tenderness over cuboid; No tenderness over N spot or navicular prominence No tenderness on posterior aspects of lateral and medial malleolus No sign of peroneal tendon subluxations; Negative tarsal tunnel tinel's Able to walk 4 steps..smnla  Ultrasound of left knee: No effusion of suprapatellar region.  Lateral meniscus with normal  Micro-calcifications in popliteus tendon  Medial meniscus normal  Posterior lateral meniscus normal  Medial posterior meniscus normal Swelling around sheath of medial gastrocnemius and semi-membranosus tendons - hypoechoic change 3 cm long and 2 cm wide consistent with baker's cyst  U/S left ankle Spurring noted on the medial aspect of talus with soft tissue calcification above spur. No other abnormal findings.  Assessment & Plan:    Baker's cyst of knee, left Patient presented today to follow up on left knee pain secondary to baker's cyst. Swelling and pain have improved with conservative management (compression). US showed mild decrease in cyst size since last office visit. Ganglion cyst on femoris tendon and linear splits on the posterior lateral meniscus were not visualized on US suggesting healing. Left talus US showed spurring with soft tissue calcification likely causing tenderness described by patient. Given significant improvement in symptoms, patient can gradually resume running activities. Continue with compression. For medial ankle pain recommended better ankle and shoe support and avoid tight fitting shoes. She will follow up in clinic as needed if symptoms worsens.   Lovena NeighboursAbdoulaye Diallo, MD  [facility] PGY-2  I observed and examined the patient with the resident and agree with assessment and plan.  Note reviewed and modified by me. Enid Baas, MD

## 2017-09-22 NOTE — Assessment & Plan Note (Addendum)
Patient presented today to follow up on left knee pain secondary to baker's cyst. Swelling and pain have improved with conservative management (compression). US showed mild decrease in cyst size since last office visit. Ganglion cyst on femoris tendon and linear splits on the posterior lateral meniscus were not visualized on US suggesting healing. Left talus US showed spurring with soft tissue calcification likely causing tenderness described by patient. Given significant improvement in symptoms, patient can gradually resume running activities. Continue with compression. For medial ankle pain recommended better ankle and shoe support and avoid tight fitting shoes. She will follow up in clinic as needed if symptoms worsens.

## 2017-11-15 ENCOUNTER — Other Ambulatory Visit: Payer: Self-pay | Admitting: Family

## 2017-11-15 NOTE — Telephone Encounter (Addendum)
Pt. returned call re: noting outbreak of Herpes on base of spine, and back of thigh this AM.  Requesting refill on the Valtrex 1000 mg tablet.  Is overdue for a  F/u, or CPE with Dr. Alvy BimlerSagardia.   Last office visit was 07/01/16.  Reported she is leaving on a trip; to be gone 11/19/17-12/08/17.  Appts. scheduled for acute visit on Friday, 11/18/17, and CPE on Friday, 12/09/17.  Will send message to Dr. Alvy BimlerSagardia to consider Rx for Valtrex.  Pt asked If this could be filled, and then to see pt. at the CPE appt. on 7/5 , otherwise she will plan on keeping appt. this Friday, 6/14.

## 2017-11-15 NOTE — Telephone Encounter (Signed)
Copied from CRM [id number]. Topic: Quick Communication - Rx Refill/Question >> Nov 15, 2017  9:11 AM Oneal GroutSebastian, Jennifer S wrote: Medication: valACYclovir (VALTREX) 1000 MG tablet   Has the patient contacted their pharmacy? Yes, no refills left (Agent: If no, request that the patient contact the pharmacy for the refill.) (Agent: If yes, when and what did the pharmacy advise?)  Preferred Pharmacy (with phone number or street name): [facility] on [address]  Agent: Please be advised that RX refills may take up to 3 business days. We ask that you follow-up with your pharmacy.

## 2017-11-15 NOTE — Telephone Encounter (Signed)
Patient called, left VM to return call back to the office to discuss symptoms needing Valtrex refill. Medication is no longer on profile. Last filled on 07/01/16 20 tab/10 refills.

## 2017-11-18 ENCOUNTER — Ambulatory Visit (INDEPENDENT_AMBULATORY_CARE_PROVIDER_SITE_OTHER): Payer: BLUE CROSS/BLUE SHIELD | Admitting: Emergency Medicine

## 2017-11-18 ENCOUNTER — Encounter: Payer: Self-pay | Admitting: Emergency Medicine

## 2017-11-18 VITALS — BP 113/70 | HR 63 | Temp 98.0°F | Resp 17 | Ht 64.0 in | Wt 114.0 lb

## 2017-11-18 DIAGNOSIS — B029 Zoster without complications: Secondary | ICD-10-CM | POA: Diagnosis not present

## 2017-11-18 DIAGNOSIS — B009 Herpesviral infection, unspecified: Secondary | ICD-10-CM | POA: Insufficient documentation

## 2017-11-18 MED ORDER — VALACYCLOVIR HCL 1 G PO TABS: 1000.0000 mg | ORAL_TABLET | Freq: Two times a day (BID) | ORAL | 1 refills | Status: AC

## 2017-11-18 NOTE — Progress Notes (Signed)
Bianca [name] 57 y.o.   Chief Complaint  Patient presents with  . herpes    HISTORY OF PRESENT ILLNESS: This is a 57 y.o. female complaining of herpes outbreak.  States she has had an outbreak every month this year.  Today has lesions on the right buttock with some radiation down the back of her right leg.  Requesting medication and inquiring about suppressive therapy.  HPI   Prior to Admission medications   Not on File    Allergies  Allergen Reactions  . Piperacillin Sod-Tazobactam So     REACTION: rash and prickly sensation    Patient Active Problem List   Diagnosis Date Noted  . HIP PAIN 07/15/2009  . Baker's cyst of knee, left 07/15/2009  . 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 DEPRESSION 10/03/2007  . BACK PAIN, LUMBAR 01/24/2007    Past Medical History:  Diagnosis Date  . Headache(784.0)   . Herpes     Past Surgical History:  Procedure Laterality Date  . SALPINGECTOMY    . TONSILLECTOMY      Social History   Socioeconomic History  . Marital status: Single    Spouse name: Not on file  . Number of children: Not on file  . Years of education: Not on file  . Highest education level: Not on file  Occupational History  . Not on file  Social Needs  . Financial resource strain: Not on file  . Food insecurity:    Worry: Not on file    Inability: Not on file  . Transportation needs:    Medical: Not on file    Non-medical: Not on file  Tobacco Use  . Smoking status: Never Smoker  . Smokeless tobacco: Never Used  Substance and Sexual Activity  . Alcohol use: Yes    Alcohol/week: 1.2 oz    Types: 2 Glasses of wine per week    Comment: socially  . Drug use: No  . Sexual activity: Not on file  Lifestyle  . Physical activity:    Days per week: Not on file    Minutes per session: Not on file  . Stress: Not on file  Relationships  . Social connections:    Talks on phone: Not on file    Gets together: Not on file    Attends religious service: Not  on file    Active member of club or organization: Not on file    Attends meetings of clubs or organizations: Not on file    Relationship status: Not on file  . Intimate partner violence:    Fear of current or ex partner: Not on file    Emotionally abused: Not on file    Physically abused: Not on file    Forced sexual activity: Not on file  Other Topics Concern  . Not on file  Social History Narrative  . Not on file    Family History  Problem Relation Age of Onset  . Sudden death Mother        hepatitis  . Alcohol abuse Father   . Hyperlipidemia Father   . Heart disease Father   . Hyperlipidemia Paternal Grandmother   . Heart disease Paternal Grandmother   . Stroke Paternal Grandmother   . Hyperlipidemia Paternal Grandfather   . Heart disease Paternal Grandfather      Review of Systems  Constitutional: Negative.  Negative for chills and fever.  HENT: Negative.  Negative for sore throat.   Eyes: Negative.  Negative for  pain, discharge and redness.  Respiratory: Negative.  Negative for shortness of breath.   Cardiovascular: Negative.   Gastrointestinal: Negative for abdominal pain, nausea and vomiting.  Skin: Positive for rash.  Neurological: Negative for dizziness and headaches.  Endo/Heme/Allergies: Negative.   All other systems reviewed and are negative.   Vitals:   11/18/17 1132  BP: 113/70  Pulse: 63  Resp: 17  Temp: 98 F (36.7 C)  SpO2: 98%    Physical Exam  Constitutional: She is oriented to person, place, and time. She appears well-developed and well-nourished.  HENT:  Head: Normocephalic and atraumatic.  Eyes: Pupils are equal, round, and reactive to light.  Neck: Normal range of motion.  Cardiovascular: Normal rate.  Pulmonary/Chest: Effort normal.  Musculoskeletal: Normal range of motion.  Neurological: She is alert and oriented to person, place, and time.  Skin: Capillary refill takes less than 2 seconds. Rash noted.  Herpetic lesions to right  buttock and back of right thigh.  Psychiatric: She has a normal mood and affect. Her behavior is normal.  Vitals reviewed.    ASSESSMENT & PLAN: Bianca [name] was seen today for herpes.  Diagnoses and all orders for this visit:  Herpes infection Comments: recurrent Orders: -     valACYclovir (VALTREX) 1000 MG tablet; Take 1 tablet (1,000 mg total) by mouth 2 (two) times daily for 7 days. Then take 1 tab daily x 6 months (suppression).  Herpes zoster without complication    Patient Instructions       IF you received an x-ray today, you will receive an invoice from [facility]. Please contact [facility] at [phone] with questions or concerns regarding your invoice.   IF you received labwork today, you will receive an invoice from [facility]. Please contact [facility] at [phone] with questions or concerns regarding your invoice.   Our billing staff will not be able to assist you with questions regarding bills from these companies.  You will be contacted with the lab results as soon as they are available. The fastest way to get your results is to activate your My Chart account. Instructions are located on the last page of this paperwork. If you have not heard from us regarding the results in 2 weeks, please contact this office.      Shingles Shingles is an infection that causes a painful skin rash and fluid-filled blisters. Shingles is caused by the same virus that causes chickenpox. Shingles only develops in people who:  Have had chickenpox.  Have gotten the chickenpox vaccine. (This is rare.)  The first symptoms of shingles may be itching, tingling, or pain in an area on your skin. A rash will follow in a few days or weeks. The rash is usually on one side of the body in a bandlike or beltlike pattern. Over time, the rash turns into fluid-filled blisters that break open, scab over, and dry up. Medicines may:  Help you manage pain.  Help you recover more  quickly.  Help to prevent long-term problems.  Follow these instructions at home: Medicines  Take medicines only as told by your doctor.  Apply an anti-itch or numbing cream to the affected area as told by your doctor. Blister and Rash Care  Take a cool bath or put cool compresses on the area of the rash or blisters as told by your doctor. This may help with pain and itching.  Keep your rash covered with a loose bandage (dressing). Wear loose-fitting clothing.  Keep your rash and blisters  clean with mild soap and cool water or as told by your doctor.  Check your rash every day for signs of infection. These include redness, swelling, and pain that lasts or gets worse.  Do not pick your blisters.  Do not scratch your rash. General instructions  Rest as told by your doctor.  Keep all follow-up visits as told by your doctor. This is important.  Until your blisters scab over, your infection can cause chickenpox in people who have never had it or been vaccinated against it. To prevent this from happening, avoid touching other people or being around other people, especially: ? Babies. ? Pregnant women. ? Children who have eczema. ? Elderly people who have transplants. ? People who have chronic illnesses, such as leukemia or AIDS. Contact a doctor if:  Your pain does not get better with medicine.  Your pain does not get better after the rash heals.  Your rash looks infected. Signs of infection include: ? Redness. ? Swelling. ? Pain that lasts or gets worse. Get help right away if:  The rash is on your face or nose.  You have pain in your face, pain around your eye area, or loss of feeling on one side of your face.  You have ear pain or you have ringing in your ear.  You have loss of taste.  Your condition gets worse. This information is not intended to replace advice given to you by your health care provider. Make sure you discuss any questions you have with your  health care provider. Document Released: 11/10/2007 Document Revised: 01/18/2016 Document Reviewed: 03/05/2014 Elsevier Interactive Patient Education  2018 Elsevier Inc.      Edwina Barth, MD [facility]

## 2017-11-18 NOTE — Patient Instructions (Addendum)
   IF you received an x-ray today, you will receive an invoice from [facility]. Please contact [facility] at [phone] with questions or concerns regarding your invoice.   IF you received labwork today, you will receive an invoice from [facility]. Please contact [facility] at [phone] with questions or concerns regarding your invoice.   Our billing staff will not be able to assist you with questions regarding bills from these companies.  You will be contacted with the lab results as soon as they are available. The fastest way to get your results is to activate your My Chart account. Instructions are located on the last page of this paperwork. If you have not heard from us regarding the results in 2 weeks, please contact this office.     Shingles Shingles is an infection that causes a painful skin rash and fluid-filled blisters. Shingles is caused by the same virus that causes chickenpox. Shingles only develops in people who:  Have had chickenpox.  Have gotten the chickenpox vaccine. (This is rare.)  The first symptoms of shingles may be itching, tingling, or pain in an area on your skin. A rash will follow in a few days or weeks. The rash is usually on one side of the body in a bandlike or beltlike pattern. Over time, the rash turns into fluid-filled blisters that break open, scab over, and dry up. Medicines may:  Help you manage pain.  Help you recover more quickly.  Help to prevent long-term problems.  Follow these instructions at home: Medicines  Take medicines only as told by your doctor.  Apply an anti-itch or numbing cream to the affected area as told by your doctor. Blister and Rash Care  Take a cool bath or put cool compresses on the area of the rash or blisters as told by your doctor. This may help with pain and itching.  Keep your rash covered with a loose bandage (dressing). Wear loose-fitting clothing.  Keep your rash and blisters clean  with mild soap and cool water or as told by your doctor.  Check your rash every day for signs of infection. These include redness, swelling, and pain that lasts or gets worse.  Do not pick your blisters.  Do not scratch your rash. General instructions  Rest as told by your doctor.  Keep all follow-up visits as told by your doctor. This is important.  Until your blisters scab over, your infection can cause chickenpox in people who have never had it or been vaccinated against it. To prevent this from happening, avoid touching other people or being around other people, especially: ? Babies. ? Pregnant women. ? Children who have eczema. ? Elderly people who have transplants. ? People who have chronic illnesses, such as leukemia or AIDS. Contact a doctor if:  Your pain does not get better with medicine.  Your pain does not get better after the rash heals.  Your rash looks infected. Signs of infection include: ? Redness. ? Swelling. ? Pain that lasts or gets worse. Get help right away if:  The rash is on your face or nose.  You have pain in your face, pain around your eye area, or loss of feeling on one side of your face.  You have ear pain or you have ringing in your ear.  You have loss of taste.  Your condition gets worse. This information is not intended to replace advice given to you by your health care provider. Make sure you discuss any questions   you have with your health care provider. Document Released: 11/10/2007 Document Revised: 01/18/2016 Document Reviewed: 03/05/2014 Elsevier Interactive Patient Education  2018 [employer] Inc.  

## 2017-11-24 ENCOUNTER — Telehealth: Payer: Self-pay | Admitting: Emergency Medicine

## 2017-11-24 NOTE — Telephone Encounter (Signed)
I left a voicemail for Bianca [name] about an appt she has with [facility] on 12[facility], the provider will not be in the office on that day and would need to be rescheduled.

## 2017-12-09 ENCOUNTER — Encounter: Payer: Self-pay | Admitting: Emergency Medicine

## 2017-12-16 ENCOUNTER — Other Ambulatory Visit: Payer: Self-pay

## 2017-12-16 ENCOUNTER — Ambulatory Visit (INDEPENDENT_AMBULATORY_CARE_PROVIDER_SITE_OTHER): Payer: BLUE CROSS/BLUE SHIELD | Admitting: Emergency Medicine

## 2017-12-16 ENCOUNTER — Encounter: Payer: Self-pay | Admitting: Emergency Medicine

## 2017-12-16 VITALS — BP 120/74 | HR 66 | Temp 98.6°F | Resp 16 | Ht 63.25 in | Wt 116.4 lb

## 2017-12-16 DIAGNOSIS — Z0000 Encounter for general adult medical examination without abnormal findings: Secondary | ICD-10-CM | POA: Diagnosis not present

## 2017-12-16 NOTE — Progress Notes (Signed)
Bianca [name] 57 y.o.   Chief Complaint  Patient presents with  . Annual Exam    HISTORY OF PRESENT ILLNESS: This is a 57 y.o. female Here for annual exam; no complaints and no medical concerns. Healthy female with no chronic medical problems.  On no medications.  Up-to-date with GYN health maintenance. Non-smoker.  Social EtOH user.  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  Stress level 4-5 in a 1-10 scale. Good nutrition.  Healthy lifestyle.  HPI   Prior to Admission medications   Medication Sig Start Date End Date Taking? Authorizing Provider  valACYclovir (VALTREX) 1000 MG tablet Take 1,000 mg by mouth daily.   Yes [provider]    Allergies  Allergen Reactions  . Piperacillin Sod-Tazobactam So     REACTION: rash and prickly sensation    Patient Active Problem List   Diagnosis Date Noted  . Herpes infection 11/18/2017  . Herpes zoster without complication 03/23/5101  . HIP PAIN 07/15/2009  . Baker's cyst of knee, left 07/15/2009  . 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 DEPRESSION 10/03/2007  . BACK PAIN, LUMBAR 01/24/2007    Past Medical History:  Diagnosis Date  . Headache(784.0)   . Herpes     Past Surgical History:  Procedure Laterality Date  . SALPINGECTOMY    . TONSILLECTOMY      Social History   Socioeconomic History  . Marital status: Single    Spouse name: Not on file  . Number of children: Not on file  . Years of education: Not on file  . Highest education level: Not on file  Occupational History  . Not on file  Social Needs  . Financial resource strain: Not on file  . Food insecurity:    Worry: Not on file    Inability: Not on file  . Transportation needs:    Medical: Not on file    Non-medical: Not on file  Tobacco Use  . Smoking status: Never Smoker  . Smokeless tobacco: Never Used  Substance and Sexual Activity  . Alcohol use: Yes    Alcohol/week: 1.2 oz    Types: 2 Glasses of wine per week    Comment: socially   . Drug use: No  . Sexual activity: Not on file  Lifestyle  . Physical activity:    Days per week: Not on file    Minutes per session: Not on file  . Stress: Not on file  Relationships  . Social connections:    Talks on phone: Not on file    Gets together: Not on file    Attends religious service: Not on file    Active member of club or organization: Not on file    Attends meetings of clubs or organizations: Not on file    Relationship status: Not on file  . Intimate partner violence:    Fear of current or ex partner: Not on file    Emotionally abused: Not on file    Physically abused: Not on file    Forced sexual activity: Not on file  Other Topics Concern  . Not on file  Social History Narrative  . Not on file    Family History  Problem Relation Age of Onset  . Sudden death Mother        hepatitis  . Alcohol abuse Father   . Hyperlipidemia Father   . Heart disease Father   . Hyperlipidemia Paternal Grandmother   . Heart disease Paternal Grandmother   .  Stroke Paternal Grandmother   . Hyperlipidemia Paternal Grandfather   . Heart disease Paternal Grandfather      Review of Systems  Constitutional: Negative.  Negative for chills, fever and weight loss.  HENT: Negative.  Negative for congestion, ear discharge, nosebleeds, sinus pain and sore throat.   Eyes: Negative.  Negative for blurred vision, double vision, discharge and redness.  Respiratory: Negative.  Negative for cough and shortness of breath.   Cardiovascular: Negative.  Negative for chest pain, palpitations and leg swelling.  Gastrointestinal: Negative.  Negative for abdominal pain, blood in stool, diarrhea, heartburn, melena, nausea and vomiting.  Genitourinary: Negative.  Negative for dysuria and hematuria.  Musculoskeletal: Negative.  Negative for back pain, myalgias and neck pain.  Skin: Negative.  Negative for rash.  Neurological: Negative.  Negative for dizziness, sensory change and headaches.   Endo/Heme/Allergies: Negative.   All other systems reviewed and are negative.   Vitals:   12/16/17 1019  BP: 120/74  Pulse: 66  Resp: 16  Temp: 98.6 F (37 C)  SpO2: 99%    Physical Exam  Constitutional: She is oriented to person, place, and time. She appears well-developed and well-nourished.  HENT:  Head: Normocephalic and atraumatic.  Nose: Nose normal.  Mouth/Throat: Oropharynx is clear and moist.  Eyes: Pupils are equal, round, and reactive to light. Conjunctivae and EOM are normal.  Neck: Normal range of motion. Neck supple. No JVD present. No thyromegaly present.  Cardiovascular: Normal rate, regular rhythm, normal heart sounds and intact distal pulses.  Pulmonary/Chest: Effort normal and breath sounds normal.  Abdominal: Soft. Bowel sounds are normal. She exhibits no distension. There is no tenderness.  Musculoskeletal: Normal range of motion.  Lymphadenopathy:    She has no cervical adenopathy.  Neurological: She is alert and oriented to person, place, and time. No sensory deficit. She exhibits normal muscle tone.  Skin: Skin is warm and dry. Capillary refill takes less than 2 seconds.  Psychiatric: She has a normal mood and affect. Her behavior is normal.  Vitals reviewed.    ASSESSMENT & PLAN: Bianca [name] was seen today for annual exam.  Diagnoses and all orders for this visit:  Routine general medical examination at a health care facility -     CBC with Differential -     Comprehensive metabolic panel -     Hemoglobin A1c -     Lipid panel -     Hepatitis C antibody screen -     HIV antibody   Patient Instructions       IF you received an x-ray today, you will receive an invoice from [facility]. Please contact [facility] at [phone] with questions or concerns regarding your invoice.   IF you received labwork today, you will receive an invoice from [facility]. Please contact [facility] at [phone] with questions or concerns  regarding your invoice.   Our billing staff will not be able to assist you with questions regarding bills from these companies.  You will be contacted with the lab results as soon as they are available. The fastest way to get your results is to activate your My Chart account. Instructions are located on the last page of this paperwork. If you have not heard from [place] regarding the results in 2 weeks, please contact this office.     Health Maintenance, Female Adopting a healthy lifestyle and getting preventive care can go a long way to promote health and wellness. Talk with your health care provider about what  schedule of regular examinations is right for you. This is a good chance for you to check in with your provider about disease prevention and staying healthy. In between checkups, there are plenty of things you can do on your own. Experts have done a lot of research about which lifestyle changes and preventive measures are most likely to keep you healthy. Ask your health care provider for more information. Weight and diet Eat a healthy diet  Be sure to include plenty of vegetables, fruits, low-fat dairy products, and lean protein.  Do not eat a lot of foods high in solid fats, added sugars, or salt.  Get regular exercise. This is one of the most important things you can do for your health. ? Most adults should exercise for at least 150 minutes each week. The exercise should increase your heart rate and make you sweat (moderate-intensity exercise). ? Most adults should also do strengthening exercises at least twice a week. This is in addition to the moderate-intensity exercise.  Maintain a healthy weight  Body mass index (BMI) is a measurement that can be used to identify possible weight problems. It estimates body fat based on height and weight. Your health care provider can help determine your BMI and help you achieve or maintain a healthy weight.  For females 40 years of age and  older: ? A BMI below 18.5 is considered underweight. ? A BMI of 18.5 to 24.9 is normal. ? A BMI of 25 to 29.9 is considered overweight. ? A BMI of 30 and above is considered obese.  Watch levels of cholesterol and blood lipids  You should start having your blood tested for lipids and cholesterol at 55 years of age, then have this test every 5 years.  You may need to have your cholesterol levels checked more often if: ? Your lipid or cholesterol levels are high. ? You are older than 55 years of age. ? You are at high risk for heart disease.  Cancer screening Lung Cancer  Lung cancer screening is recommended for adults 33-65 years old who are at high risk for lung cancer because of a history of smoking.  A yearly low-dose CT scan of the lungs is recommended for people who: ? Currently smoke. ? Have quit within the past 15 years. ? Have at least a 30-pack-year history of smoking. A pack year is smoking an average of one pack of cigarettes a day for 1 year.  Yearly screening should continue until it has been 15 years since you quit.  Yearly screening should stop if you develop a health problem that would prevent you from having lung cancer treatment.  Breast Cancer  Practice breast self-awareness. This means understanding how your breasts normally appear and feel.  It also means doing regular breast self-exams. Let your health care provider know about any changes, no matter how small.  If you are in your 20s or 30s, you should have a clinical breast exam (CBE) by a health care provider every 1-3 years as part of a regular health exam.  If you are 47 or older, have a CBE every year. Also consider having a breast X-ray (mammogram) every year.  If you have a family history of breast cancer, talk to your health care provider about genetic screening.  If you are at high risk for breast cancer, talk to your health care provider about having an MRI and a mammogram every year.  Breast  cancer gene (BRCA) assessment is recommended for women  who have family members with BRCA-related cancers. BRCA-related cancers include: ? Breast. ? Ovarian. ? Tubal. ? Peritoneal cancers.  Results of the assessment will determine the need for genetic counseling and BRCA1 and BRCA2 testing.  Cervical Cancer Your health care provider may recommend that you be screened regularly for cancer of the pelvic organs (ovaries, uterus, and vagina). This screening involves a pelvic examination, including checking for microscopic changes to the surface of your cervix (Pap test). You may be encouraged to have this screening done every 3 years, beginning at age 40.  For women ages 49-65, health care providers may recommend pelvic exams and Pap testing every 3 years, or they may recommend the Pap and pelvic exam, combined with testing for human papilloma virus (HPV), every 5 years. Some types of HPV increase your risk of cervical cancer. Testing for HPV may also be done on women of any age with unclear Pap test results.  Other health care providers may not recommend any screening for nonpregnant women who are considered low risk for pelvic cancer and who do not have symptoms. Ask your health care provider if a screening pelvic exam is right for you.  If you have had past treatment for cervical cancer or a condition that could lead to cancer, you need Pap tests and screening for cancer for at least 20 years after your treatment. If Pap tests have been discontinued, your risk factors (such as having a new sexual partner) need to be reassessed to determine if screening should resume. Some women have medical problems that increase the chance of getting cervical cancer. In these cases, your health care provider may recommend more frequent screening and Pap tests.  Colorectal Cancer  This type of cancer can be detected and often prevented.  Routine colorectal cancer screening usually begins at 55 years of age and  continues through 55 years of age.  Your health care provider may recommend screening at an earlier age if you have risk factors for colon cancer.  Your health care provider may also recommend using home test kits to check for hidden blood in the stool.  A small camera at the end of a tube can be used to examine your colon directly (sigmoidoscopy or colonoscopy). This is done to check for the earliest forms of colorectal cancer.  Routine screening usually begins at age 85.  Direct examination of the colon should be repeated every 5-10 years through 55 years of age. However, you may need to be screened more often if early forms of precancerous polyps or small growths are found.  Skin Cancer  Check your skin from head to toe regularly.  Tell your health care provider about any new moles or changes in moles, especially if there is a change in a mole's shape or color.  Also tell your health care provider if you have a mole that is larger than the size of a pencil eraser.  Always use sunscreen. Apply sunscreen liberally and repeatedly throughout the day.  Protect yourself by wearing long sleeves, pants, a wide-brimmed hat, and sunglasses whenever you are outside.  Heart disease, diabetes, and high blood pressure  High blood pressure causes heart disease and increases the risk of stroke. High blood pressure is more likely to develop in: ? People who have blood pressure in the high end of the normal range (130-139/85-89 mm Hg). ? People who are overweight or obese. ? People who are African American.  If you are 49-64 years of age, have your  blood pressure checked every 3-5 years. If you are 95 years of age or older, have your blood pressure checked every year. You should have your blood pressure measured twice-once when you are at a hospital or clinic, and once when you are not at a hospital or clinic. Record the average of the two measurements. To check your blood pressure when you are not  at a hospital or clinic, you can use: ? An automated blood pressure machine at a pharmacy. ? A home blood pressure monitor.  If you are between 30 years and 29 years old, ask your health care provider if you should take aspirin to prevent strokes.  Have regular diabetes screenings. This involves taking a blood sample to check your fasting blood sugar level. ? If you are at a normal weight and have a low risk for diabetes, have this test once every three years after 55 years of age. ? If you are overweight and have a high risk for diabetes, consider being tested at a younger age or more often. Preventing infection Hepatitis B  If you have a higher risk for hepatitis B, you should be screened for this virus. You are considered at high risk for hepatitis B if: ? You were born in a country where hepatitis B is common. Ask your health care provider which countries are considered high risk. ? Your parents were born in a high-risk country, and you have not been immunized against hepatitis B (hepatitis B vaccine). ? You have HIV or AIDS. ? You use needles to inject street drugs. ? You live with someone who has hepatitis B. ? You have had sex with someone who has hepatitis B. ? You get hemodialysis treatment. ? You take certain medicines for conditions, including cancer, organ transplantation, and autoimmune conditions.  Hepatitis C  Blood testing is recommended for: ? Everyone born from [birth year] through 1963. ? Anyone with known risk factors for hepatitis C.  Sexually transmitted infections (STIs)  You should be screened for sexually transmitted infections (STIs) including gonorrhea and chlamydia if: ? You are sexually active and are younger than 55 years of age. ? You are older than 55 years of age and your health care provider tells you that you are at risk for this type of infection. ? Your sexual activity has changed since you were last screened and you are at an increased risk for chlamydia  or gonorrhea. Ask your health care provider if you are at risk.  If you do not have HIV, but are at risk, it may be recommended that you take a prescription medicine daily to prevent HIV infection. This is called pre-exposure prophylaxis (PrEP). You are considered at risk if: ? You are sexually active and do not regularly use condoms or know the HIV status of your partner(s). ? You take drugs by injection. ? You are sexually active with a partner who has HIV.  Talk with your health care provider about whether you are at high risk of being infected with HIV. If you choose to begin PrEP, you should first be tested for HIV. You should then be tested every 3 months for as long as you are taking PrEP. Pregnancy  If you are premenopausal and you may become pregnant, ask your health care provider about preconception counseling.  If you may become pregnant, take 400 to 800 micrograms (mcg) of folic acid every day.  If you want to prevent pregnancy, talk to your health care provider about birth control (contraception).  Osteoporosis and menopause  Osteoporosis is a disease in which the bones lose minerals and strength with aging. This can result in serious bone fractures. Your risk for osteoporosis can be identified using a bone density scan.  If you are 65 years of age or older, or if you are at risk for osteoporosis and fractures, ask your health care provider if you should be screened.  Ask your health care provider whether you should take a calcium or vitamin D supplement to lower your risk for osteoporosis.  Menopause may have certain physical symptoms and risks.  Hormone replacement therapy may reduce some of these symptoms and risks. Talk to your health care provider about whether hormone replacement therapy is right for you. Follow these instructions at home:  Schedule regular health, dental, and eye exams.  Stay current with your immunizations.  Do not use any tobacco products  including cigarettes, chewing tobacco, or electronic cigarettes.  If you are pregnant, do not drink alcohol.  If you are breastfeeding, limit how much and how often you drink alcohol.  Limit alcohol intake to no more than 1 drink per day for nonpregnant women. One drink equals 12 ounces of beer, 5 ounces of wine, or 1 ounces of hard liquor.  Do not use street drugs.  Do not share needles.  Ask your health care provider for help if you need support or information about quitting drugs.  Tell your health care provider if you often feel depressed.  Tell your health care provider if you have ever been abused or do not feel safe at home. This information is not intended to replace advice given to you by your health care provider. Make sure you discuss any questions you have with your health care provider. Document Released: 12/07/2010 Document Revised: 10/30/2015 Document Reviewed: 02/25/2015 [employer]vier Interactive Patient Education  2018 [employer]      Agustina Caroli, MD [facility]

## 2017-12-16 NOTE — Patient Instructions (Addendum)
   IF you received an x-ray today, you will receive an invoice from [facility]. Please contact [facility] at [phone] with questions or concerns regarding your invoice.   IF you received labwork today, you will receive an invoice from [facility]. Please contact [facility] at [phone] with questions or concerns regarding your invoice.   Our billing staff will not be able to assist you with questions regarding bills from these companies.  You will be contacted with the lab results as soon as they are available. The fastest way to get your results is to activate your My Chart account. Instructions are located on the last page of this paperwork. If you have not heard from us regarding the results in 2 weeks, please contact this office.     Health Maintenance, Female Adopting a healthy lifestyle and getting preventive care can go a long way to promote health and wellness. Talk with your health care provider about what schedule of regular examinations is right for you. This is a good chance for you to check in with your provider about disease prevention and staying healthy. In between checkups, there are plenty of things you can do on your own. Experts have done a lot of research about which lifestyle changes and preventive measures are most likely to keep you healthy. Ask your health care provider for more information. Weight and diet Eat a healthy diet  Be sure to include plenty of vegetables, fruits, low-fat dairy products, and lean protein.  Do not eat a lot of foods high in solid fats, added sugars, or salt.  Get regular exercise. This is one of the most important things you can do for your health. ? Most adults should exercise for at least 150 minutes each week. The exercise should increase your heart rate and make you sweat (moderate-intensity exercise). ? Most adults should also do strengthening exercises at least twice a week. This is in addition to the  moderate-intensity exercise.  Maintain a healthy weight  Body mass index (BMI) is a measurement that can be used to identify possible weight problems. It estimates body fat based on height and weight. Your health care provider can help determine your BMI and help you achieve or maintain a healthy weight.  For females 20 years of age and older: ? A BMI below 18.5 is considered underweight. ? A BMI of 18.5 to 24.9 is normal. ? A BMI of 25 to 29.9 is considered overweight. ? A BMI of 30 and above is considered obese.  Watch levels of cholesterol and blood lipids  You should start having your blood tested for lipids and cholesterol at 55 years of age, then have this test every 5 years.  You may need to have your cholesterol levels checked more often if: ? Your lipid or cholesterol levels are high. ? You are older than 55 years of age. ? You are at high risk for heart disease.  Cancer screening Lung Cancer  Lung cancer screening is recommended for adults 55-80 years old who are at high risk for lung cancer because of a history of smoking.  A yearly low-dose CT scan of the lungs is recommended for people who: ? Currently smoke. ? Have quit within the past 15 years. ? Have at least a 30-pack-year history of smoking. A pack year is smoking an average of one pack of cigarettes a day for 1 year.  Yearly screening should continue until it has been 15 years since you quit.  Yearly   screening should stop if you develop a health problem that would prevent you from having lung cancer treatment.  Breast Cancer  Practice breast self-awareness. This means understanding how your breasts normally appear and feel.  It also means doing regular breast self-exams. Let your health care provider know about any changes, no matter how small.  If you are in your 20s or 30s, you should have a clinical breast exam (CBE) by a health care provider every 1-3 years as part of a regular health exam.  If you  are 73 or older, have a CBE every year. Also consider having a breast X-ray (mammogram) every year.  If you have a family history of breast cancer, talk to your health care provider about genetic screening.  If you are at high risk for breast cancer, talk to your health care provider about having an MRI and a mammogram every year.  Breast cancer gene (BRCA) assessment is recommended for women who have family members with BRCA-related cancers. BRCA-related cancers include: ? Breast. ? Ovarian. ? Tubal. ? Peritoneal cancers.  Results of the assessment will determine the need for genetic counseling and BRCA1 and BRCA2 testing.  Cervical Cancer Your health care provider may recommend that you be screened regularly for cancer of the pelvic organs (ovaries, uterus, and vagina). This screening involves a pelvic examination, including checking for microscopic changes to the surface of your cervix (Pap test). You may be encouraged to have this screening done every 3 years, beginning at age 61.  For women ages 90-65, health care providers may recommend pelvic exams and Pap testing every 3 years, or they may recommend the Pap and pelvic exam, combined with testing for human papilloma virus (HPV), every 5 years. Some types of HPV increase your risk of cervical cancer. Testing for HPV may also be done on women of any age with unclear Pap test results.  Other health care providers may not recommend any screening for nonpregnant women who are considered low risk for pelvic cancer and who do not have symptoms. Ask your health care provider if a screening pelvic exam is right for you.  If you have had past treatment for cervical cancer or a condition that could lead to cancer, you need Pap tests and screening for cancer for at least 20 years after your treatment. If Pap tests have been discontinued, your risk factors (such as having a new sexual partner) need to be reassessed to determine if screening should  resume. Some women have medical problems that increase the chance of getting cervical cancer. In these cases, your health care provider may recommend more frequent screening and Pap tests.  Colorectal Cancer  This type of cancer can be detected and often prevented.  Routine colorectal cancer screening usually begins at 55 years of age and continues through 55 years of age.  Your health care provider may recommend screening at an earlier age if you have risk factors for colon cancer.  Your health care provider may also recommend using home test kits to check for hidden blood in the stool.  A small camera at the end of a tube can be used to examine your colon directly (sigmoidoscopy or colonoscopy). This is done to check for the earliest forms of colorectal cancer.  Routine screening usually begins at age 67.  Direct examination of the colon should be repeated every 5-10 years through 55 years of age. However, you may need to be screened more often if early forms of precancerous polyps  or small growths are found.  Skin Cancer  Check your skin from head to toe regularly.  Tell your health care provider about any new moles or changes in moles, especially if there is a change in a mole's shape or color.  Also tell your health care provider if you have a mole that is larger than the size of a pencil eraser.  Always use sunscreen. Apply sunscreen liberally and repeatedly throughout the day.  Protect yourself by wearing long sleeves, pants, a wide-brimmed hat, and sunglasses whenever you are outside.  Heart disease, diabetes, and high blood pressure  High blood pressure causes heart disease and increases the risk of stroke. High blood pressure is more likely to develop in: ? People who have blood pressure in the high end of the normal range (130-139/85-89 mm Hg). ? People who are overweight or obese. ? People who are African American.  If you are 18-39 years of age, have your blood  pressure checked every 3-5 years. If you are 40 years of age or older, have your blood pressure checked every year. You should have your blood pressure measured twice-once when you are at a hospital or clinic, and once when you are not at a hospital or clinic. Record the average of the two measurements. To check your blood pressure when you are not at a hospital or clinic, you can use: ? An automated blood pressure machine at a pharmacy. ? A home blood pressure monitor.  If you are between 55 years and 79 years old, ask your health care provider if you should take aspirin to prevent strokes.  Have regular diabetes screenings. This involves taking a blood sample to check your fasting blood sugar level. ? If you are at a normal weight and have a low risk for diabetes, have this test once every three years after 55 years of age. ? If you are overweight and have a high risk for diabetes, consider being tested at a younger age or more often. Preventing infection Hepatitis B  If you have a higher risk for hepatitis B, you should be screened for this virus. You are considered at high risk for hepatitis B if: ? You were born in a country where hepatitis B is common. Ask your health care provider which countries are considered high risk. ? Your parents were born in a high-risk country, and you have not been immunized against hepatitis B (hepatitis B vaccine). ? You have HIV or AIDS. ? You use needles to inject street drugs. ? You live with someone who has hepatitis B. ? You have had sex with someone who has hepatitis B. ? You get hemodialysis treatment. ? You take certain medicines for conditions, including cancer, organ transplantation, and autoimmune conditions.  Hepatitis C  Blood testing is recommended for: ? Everyone born from 1945 through 1965. ? Anyone with known risk factors for hepatitis C.  Sexually transmitted infections (STIs)  You should be screened for sexually transmitted  infections (STIs) including gonorrhea and chlamydia if: ? You are sexually active and are younger than 55 years of age. ? You are older than 55 years of age and your health care provider tells you that you are at risk for this type of infection. ? Your sexual activity has changed since you were last screened and you are at an increased risk for chlamydia or gonorrhea. Ask your health care provider if you are at risk.  If you do not have HIV, but are at risk,   it may be recommended that you take a prescription medicine daily to prevent HIV infection. This is called pre-exposure prophylaxis (PrEP). You are considered at risk if: ? You are sexually active and do not regularly use condoms or know the HIV status of your partner(s). ? You take drugs by injection. ? You are sexually active with a partner who has HIV.  Talk with your health care provider about whether you are at high risk of being infected with HIV. If you choose to begin PrEP, you should first be tested for HIV. You should then be tested every 3 months for as long as you are taking PrEP. Pregnancy  If you are premenopausal and you may become pregnant, ask your health care provider about preconception counseling.  If you may become pregnant, take 400 to 800 micrograms (mcg) of folic acid every day.  If you want to prevent pregnancy, talk to your health care provider about birth control (contraception). Osteoporosis and menopause  Osteoporosis is a disease in which the bones lose minerals and strength with aging. This can result in serious bone fractures. Your risk for osteoporosis can be identified using a bone density scan.  If you are 34 years of age or older, or if you are at risk for osteoporosis and fractures, ask your health care provider if you should be screened.  Ask your health care provider whether you should take a calcium or vitamin D supplement to lower your risk for osteoporosis.  Menopause may have certain physical  symptoms and risks.  Hormone replacement therapy may reduce some of these symptoms and risks. Talk to your health care provider about whether hormone replacement therapy is right for you. Follow these instructions at home:  Schedule regular health, dental, and eye exams.  Stay current with your immunizations.  Do not use any tobacco products including cigarettes, chewing tobacco, or electronic cigarettes.  If you are pregnant, do not drink alcohol.  If you are breastfeeding, limit how much and how often you drink alcohol.  Limit alcohol intake to no more than 1 drink per day for nonpregnant women. One drink equals 12 ounces of beer, 5 ounces of wine, or 1 ounces of hard liquor.  Do not use street drugs.  Do not share needles.  Ask your health care provider for help if you need support or information about quitting drugs.  Tell your health care provider if you often feel depressed.  Tell your health care provider if you have ever been abused or do not feel safe at home. This information is not intended to replace advice given to you by your health care provider. Make sure you discuss any questions you have with your health care provider. Document Released: 12/07/2010 Document Revised: 10/30/2015 Document Reviewed: 02/25/2015 Elsevier Interactive Patient Education  [employer].

## 2017-12-17 LAB — CBC WITH DIFFERENTIAL/PLATELET
Basophils Absolute: 0 10*3/uL (ref 0.0–0.2)
Basos: 1 %
EOS (ABSOLUTE): 0.1 10*3/uL (ref 0.0–0.4)
Eos: 1 %
Hematocrit: 40.8 % (ref 34.0–46.6)
Hemoglobin: 13.9 g/dL (ref 11.1–15.9)
Immature Grans (Abs): 0 10*3/uL (ref 0.0–0.1)
Immature Granulocytes: 0 %
Lymphocytes Absolute: 1.4 10*3/uL (ref 0.7–3.1)
Lymphs: 30 %
MCH: 32.6 pg (ref 26.6–33.0)
MCHC: 34.1 g/dL (ref 31.5–35.7)
MCV: 96 fL (ref 79–97)
Monocytes Absolute: 0.3 10*3/uL (ref 0.1–0.9)
Monocytes: 5 %
Neutrophils Absolute: 2.9 10*3/uL (ref 1.4–7.0)
Neutrophils: 63 %
Platelets: 232 10*3/uL (ref 150–450)
RBC: 4.27 x10E6/uL (ref 3.77–5.28)
RDW: 15 % (ref 12.3–15.4)
WBC: 4.7 10*3/uL (ref 3.4–10.8)

## 2017-12-17 LAB — COMPREHENSIVE METABOLIC PANEL
ALT: 17 IU/L (ref 0–32)
AST: 22 IU/L (ref 0–40)
Albumin/Globulin Ratio: 2.6 — ABNORMAL HIGH (ref 1.2–2.2)
Albumin: 5 g/dL (ref 3.5–5.5)
Alkaline Phosphatase: 61 IU/L (ref 39–117)
BUN/Creatinine Ratio: 16 (ref 9–23)
BUN: 13 mg/dL (ref 6–24)
Bilirubin Total: 0.4 mg/dL (ref 0.0–1.2)
CO2: 24 mmol/L (ref 20–29)
Calcium: 10.2 mg/dL (ref 8.7–10.2)
Chloride: 98 mmol/L (ref 96–106)
Creatinine, Ser: 0.8 mg/dL (ref 0.57–1.00)
GFR calc Af Amer: 96 mL/min/{1.73_m2} (ref >59)
GFR calc non Af Amer: 83 mL/min/{1.73_m2} (ref >59)
Globulin, Total: 1.9 g/dL (ref 1.5–4.5)
Glucose: 89 mg/dL (ref 65–99)
Potassium: 4.4 mmol/L (ref 3.5–5.2)
Sodium: 137 mmol/L (ref 134–144)
Total Protein: 6.9 g/dL (ref 6.0–8.5)

## 2017-12-17 LAB — HEMOGLOBIN A1C
Est. average glucose Bld gHb Est-mCnc: 105 mg/dL
Hgb A1c MFr Bld: 5.3 % (ref 4.8–5.6)

## 2017-12-17 LAB — LIPID PANEL
Chol/HDL Ratio: 2.2 ratio (ref 0.0–4.4)
Cholesterol, Total: 241 mg/dL — ABNORMAL HIGH (ref 100–199)
HDL: 112 mg/dL (ref >39)
LDL Calculated: 122 mg/dL — ABNORMAL HIGH (ref 0–99)
Triglycerides: 35 mg/dL (ref 0–149)
VLDL Cholesterol Cal: 7 mg/dL (ref 5–40)

## 2017-12-17 LAB — HIV ANTIBODY (ROUTINE TESTING W REFLEX): HIV Screen 4th Generation wRfx: NONREACTIVE

## 2017-12-17 LAB — HEPATITIS C ANTIBODY: Hep C Virus Ab: <0.1 s/co ratio (ref 0.0–0.9)

## 2018-08-22 ENCOUNTER — Ambulatory Visit: Payer: BLUE CROSS/BLUE SHIELD | Admitting: Emergency Medicine

## 2018-11-09 ENCOUNTER — Other Ambulatory Visit: Payer: Self-pay | Admitting: Emergency Medicine

## 2018-11-09 NOTE — Telephone Encounter (Signed)
Requested medication (s) are due for refill today: Yes  Requested medication (s) are on the active medication list: Yes  Last refill:  10 months by historical provider  Future visit scheduled: No  Notes to clinic:  Unable to refill, last refilled by another provider     Requested Prescriptions  Pending Prescriptions Disp Refills   valACYclovir (VALTREX) 1000 MG tablet [Pharmacy Med Name: VALACYCLOVIR 1GM TABLETS] 200 tablet     Sig: TAKE 1 TABLET BY MOUTH TWICE DAILY FOR 7 DAYS, THEN TAKE 1 TABLET DAILY FOR 6 MONTHS( SUPPRESSION)     Antimicrobials:  Antiviral Agents - Anti-Herpetic Passed - 11/09/2018  9:57 AM      Passed - Valid encounter within last 12 months    Recent Outpatient Visits          10 months ago Routine general medical examination at a health care facility   [facility] Care at [facility], [place], [place]   11 months ago Herpes infection   Primary Care at [facility], Eilleen Kempf, [place]   2 years ago Herpes infection   Primary Care at [facility], [place], [place]   6 years ago Preventative health care   [facility], [place], [place]   7 years ago Chest pain   [facility], [place], [place]

## 2018-11-30 ENCOUNTER — Telehealth (INDEPENDENT_AMBULATORY_CARE_PROVIDER_SITE_OTHER): Payer: BC Managed Care – PPO | Admitting: Emergency Medicine

## 2018-11-30 ENCOUNTER — Encounter: Payer: Self-pay | Admitting: Emergency Medicine

## 2018-11-30 ENCOUNTER — Other Ambulatory Visit: Payer: Self-pay

## 2018-11-30 VITALS — Ht 64.0 in | Wt 112.0 lb

## 2018-11-30 DIAGNOSIS — Z760 Encounter for issue of repeat prescription: Secondary | ICD-10-CM

## 2018-11-30 DIAGNOSIS — Z1211 Encounter for screening for malignant neoplasm of colon: Secondary | ICD-10-CM

## 2018-11-30 DIAGNOSIS — Z8619 Personal history of other infectious and parasitic diseases: Secondary | ICD-10-CM

## 2018-11-30 MED ORDER — VALACYCLOVIR HCL 1 G PO TABS: 1000.0000 mg | ORAL_TABLET | Freq: Every day | ORAL | 3 refills | Status: DC

## 2018-11-30 NOTE — Progress Notes (Signed)
Called patient to triage for appointment. Patient needs medication refill on Valacyclovir and patient wants to use Cologuard for colonoscopy. Patient will schedule a mammogram. Patient states she does not go to the doctor much and no other complaints.

## 2018-11-30 NOTE — Progress Notes (Signed)
Telemedicine Encounter- SOAP NOTE Established Patient  This telephone encounter was conducted with the patient's (or proxy's) verbal consent via audio telecommunications: yes/no: Yes Patient was instructed to have this encounter in a suitably private space; and to only have persons present to whom they give permission to participate. In addition, patient identity was confirmed by use of name plus two identifiers (DOB and address).  I discussed the limitations, risks, security and privacy concerns of performing an evaluation and management service by telephone and the availability of in person appointments. I also discussed with the patient that there may be a patient responsible charge related to this service. The patient expressed understanding and agreed to proceed.  I spent a total of TIME; 0 MIN TO 60 MIN: 10 minutes talking with the patient or their proxy.  No chief complaint on file. Medication refill  Subjective   Bianca [name] is a 58 y.o. female established patient. Telephone visit today for valacyclovir medication refill.  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  No other complaints or medical concerns today. Colonoscopy option offer for colon cancer screening but patient prefers the Cologuard option.  HPI   Patient Active Problem List   Diagnosis Date Noted  . Herpes infection 11/18/2017  . Herpes zoster without complication 11/18/2017  . Baker's cyst of knee, left 07/15/2009  . 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 DEPRESSION 10/03/2007    Past Medical History:  Diagnosis Date  . Headache(784.0)   . Herpes     Current Outpatient Medications  Medication Sig Dispense Refill  . Cholecalciferol (VITAMIN D3 PO) Take by mouth daily.    . Coenzyme Q10 (COQ10 PO) Take by mouth daily.    Marland Kitchen. KRILL OIL PO Take by mouth daily.    Marland Kitchen. MAGNESIUM PO Take by mouth daily.    . Probiotic Product (PROBIOTIC DAILY PO)  Take by mouth.    . valACYclovir (VALTREX) 1000 MG tablet Take 1 tablet (1,000 mg total) by mouth daily. 90 tablet 3   No current facility-administered medications for this visit.     Allergies  Allergen Reactions  . Piperacillin Sod-Tazobactam So     REACTION: rash and prickly sensation    Social History   Socioeconomic History  . Marital status: Married    Spouse name: Not on file  . Number of children: Not on file  . Years of education: Not on file  . Highest education level: Not on file  Occupational History  . Occupation: attorney  Social Needs  . Financial resource strain: Not on file  . Food insecurity    Worry: Not on file    Inability: Not on file  . Transportation needs    Medical: Not on file    Non-medical: Not on file  Tobacco Use  . Smoking status: Never Smoker  . Smokeless tobacco: Never Used  Substance and Sexual Activity  . Alcohol use: Yes    Alcohol/week: 2.0 standard drinks    Types: 2 Glasses of wine per week    Comment: socially  . Drug use: No  . Sexual activity: Not on file  Lifestyle  . Physical activity    Days per week: Not on file    Minutes per session: Not on file  . Stress: Not on file  Relationships  . Social Musicianconnections    Talks on phone: Not on file    Gets together: Not on file  Attends religious service: Not on file    Active member of club or organization: Not on file    Attends meetings of clubs or organizations: Not on file    Relationship status: Not on file  . Intimate partner violence    Fear of current or ex partner: Not on file    Emotionally abused: Not on file    Physically abused: Not on file    Forced sexual activity: Not on file  Other Topics Concern  . Not on file  Social History Narrative  . Not on file    Review of Systems  Constitutional: Negative.  Negative for chills and fever.  HENT: Negative.  Negative for congestion and sore throat.   Eyes: Negative.   Respiratory: Negative.  Negative for  cough and shortness of breath.   Cardiovascular: Negative for chest pain and palpitations.  Gastrointestinal: Negative for abdominal pain, diarrhea, nausea and vomiting.  Genitourinary: Negative.  Negative for dysuria.  Skin: Negative.  Negative for rash.  Neurological: Negative for dizziness and headaches.  All other systems reviewed and are negative.   Objective   Vitals as reported by the patient: Today's Vitals   11/30/18 0958  Weight: 112 lb (50.8 kg)  Height: 5\' 4"  (1.626 m)   Awake and oriented x3 in no apparent respiratory distress. Diagnoses and all orders for this visit:  Encounter for medication refill  History of herpes zoster -     valACYclovir (VALTREX) 1000 MG tablet; Take 1 tablet (1,000 mg total) by mouth daily.  Colon cancer screening -     Cologuard  Clinically stable.  No medical concerns identified during this visit. Follow-up in the office as needed.   I discussed the assessment and treatment plan with the patient. The patient was provided an opportunity to ask questions and all were answered. The patient agreed with the plan and demonstrated an understanding of the instructions.   The patient was advised to call back or seek an in-person evaluation if the symptoms worsen or if the condition fails to improve as anticipated.  I provided 10 minutes of non-face-to-face time during this encounter.  Horald Pollen, MD  Primary Care at [facility]

## 2018-12-17 DIAGNOSIS — Z1211 Encounter for screening for malignant neoplasm of colon: Secondary | ICD-10-CM | POA: Diagnosis not present

## 2018-12-27 LAB — COLOGUARD: Cologuard: NEGATIVE

## 2019-01-11 DIAGNOSIS — D225 Melanocytic nevi of trunk: Secondary | ICD-10-CM | POA: Diagnosis not present

## 2019-01-11 DIAGNOSIS — D2261 Melanocytic nevi of right upper limb, including shoulder: Secondary | ICD-10-CM | POA: Diagnosis not present

## 2019-01-11 DIAGNOSIS — L821 Other seborrheic keratosis: Secondary | ICD-10-CM | POA: Diagnosis not present

## 2019-01-11 DIAGNOSIS — D2262 Melanocytic nevi of left upper limb, including shoulder: Secondary | ICD-10-CM | POA: Diagnosis not present

## 2019-03-29 ENCOUNTER — Other Ambulatory Visit: Payer: Self-pay

## 2019-03-29 DIAGNOSIS — Z20822 Contact with and (suspected) exposure to covid-19: Secondary | ICD-10-CM

## 2019-03-31 LAB — NOVEL CORONAVIRUS, NAA: SARS-CoV-2, NAA: NOT DETECTED

## 2019-08-28 ENCOUNTER — Encounter: Payer: Self-pay | Admitting: Emergency Medicine

## 2019-12-02 ENCOUNTER — Other Ambulatory Visit: Payer: Self-pay | Admitting: Emergency Medicine

## 2019-12-02 DIAGNOSIS — Z8619 Personal history of other infectious and parasitic diseases: Secondary | ICD-10-CM

## 2019-12-02 NOTE — Telephone Encounter (Signed)
Requested Prescriptions  Pending Prescriptions Disp Refills  . valACYclovir (VALTREX) 1000 MG tablet [Pharmacy Med Name: VALACYCLOVIR 1GM TABLETS] 30 tablet 0    Sig: TAKE 1 TABLET(1000 MG) BY MOUTH DAILY     Antimicrobials:  Antiviral Agents - Anti-Herpetic Failed - 12/02/2019  3:33 AM      Failed - Valid encounter within last 12 months    Recent Outpatient Visits          1 year ago Encounter for medication refill   Primary Care at [place], Eilleen Kempf, [place]   1 year ago Routine general medical examination at a health care facility   Primary Care at [place], Eilleen Kempf, [place]   2 years ago Herpes infection   Primary Care at [facility], Eilleen Kempf, [place]   3 years ago Herpes infection   Primary Care at [facility], [place]             Patient seen on 11/30/2018.  Courtesy refill given for 30 day supply with request for patient to to schedule an appointment for follow-up.

## 2019-12-06 ENCOUNTER — Encounter: Payer: Self-pay | Admitting: Emergency Medicine

## 2019-12-06 ENCOUNTER — Telehealth (INDEPENDENT_AMBULATORY_CARE_PROVIDER_SITE_OTHER): Payer: BC Managed Care – PPO | Admitting: Emergency Medicine

## 2019-12-06 ENCOUNTER — Other Ambulatory Visit: Payer: Self-pay

## 2019-12-06 VITALS — Ht 64.0 in | Wt 115.0 lb

## 2019-12-06 DIAGNOSIS — Z8619 Personal history of other infectious and parasitic diseases: Secondary | ICD-10-CM | POA: Diagnosis not present

## 2019-12-06 DIAGNOSIS — B009 Herpesviral infection, unspecified: Secondary | ICD-10-CM | POA: Diagnosis not present

## 2019-12-06 MED ORDER — VALACYCLOVIR HCL 1 G PO TABS: ORAL_TABLET | ORAL | 3 refills | Status: DC

## 2019-12-06 NOTE — Patient Instructions (Signed)
° ° ° °  If you have lab work done today you will be contacted with your lab results within the next 2 weeks.  If you have not heard from us then please contact us. The fastest way to get your results is to register for My Chart. ° ° °IF you received an x-ray today, you will receive an invoice from [facility]. Please contact [facility] at [phone] with questions or concerns regarding your invoice.  ° °IF you received labwork today, you will receive an invoice from [facility]. Please contact [facility] at [phone] with questions or concerns regarding your invoice.  ° °Our billing staff will not be able to assist you with questions regarding bills from these companies. ° °You will be contacted with the lab results as soon as they are available. The fastest way to get your results is to activate your My Chart account. Instructions are located on the last page of this paperwork. If you have not heard from us regarding the results in 2 weeks, please contact this office. °  ° ° ° °

## 2019-12-06 NOTE — Progress Notes (Signed)
Telemedicine Encounter- SOAP NOTE Established Patient  This telephone encounter was conducted with the patient's (or proxy's) verbal consent via audio telecommunications: yes/no: Yes Patient was instructed to have this encounter in a suitably private space; and to only have persons present to whom they give permission to participate. In addition, patient identity was confirmed by use of name plus two identifiers (DOB and address).  I discussed the limitations, risks, security and privacy concerns of performing an evaluation and management service by telephone and the availability of in person appointments. I also discussed with the patient that there may be a patient responsible charge related to this service. The patient expressed understanding and agreed to proceed.  I spent a total of TIME; 0 MIN TO 60 MIN: 10 minutes talking with the patient or their proxy.  Chief Complaint  Patient presents with  . Medication Refill    Valacyclovir -- PATIENT PREFERS PHONE CALL    Subjective   Bianca [name] is a 59 y.o. female established patient. Telephone visit today for medication refill. Patient has a history of recurrent herpes infections.  Takes valacyclovir 1000 mg once a day.  Working well for her. Now she has been getting average of 2 infections per year as opposed to monthly. No other complaints or medical concerns today.  HPI   Patient Active Problem List   Diagnosis Date Noted  . Herpes infection 11/18/2017  . Herpes zoster without complication 11/18/2017  . Baker's cyst of knee, left 07/15/2009  . 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 DEPRESSION 10/03/2007    Past Medical History:  Diagnosis Date  . Headache(784.0)   . Herpes     Current Outpatient Medications  Medication Sig Dispense Refill  . Cholecalciferol (VITAMIN D3 PO) Take by mouth daily.    . Coenzyme Q10 (COQ10 PO) Take by mouth daily.    Marland Kitchen KRILL OIL PO Take by mouth daily.    Marland Kitchen MAGNESIUM PO Take by mouth  daily.    . Probiotic Product (PROBIOTIC DAILY PO) Take by mouth.    . valACYclovir (VALTREX) 1000 MG tablet TAKE 1 TABLET(1000 MG) BY MOUTH DAILY 30 tablet 0   No current facility-administered medications for this visit.    Allergies  Allergen Reactions  . Other Anaphylaxis, Hives, Itching and Shortness Of Breath  . Piperacillin Sod-Tazobactam So     REACTION: rash and prickly sensation    Social History   Socioeconomic History  . Marital status: Married    Spouse name: Not on file  . Number of children: Not on file  . Years of education: Not on file  . Highest education level: Not on file  Occupational History  . Occupation: attorney  Tobacco Use  . Smoking status: Never Smoker  . Smokeless tobacco: Never Used  Substance and Sexual Activity  . Alcohol use: Yes    Alcohol/week: 2.0 standard drinks    Types: 2 Glasses of wine per week    Comment: socially  . Drug use: No  . Sexual activity: Not on file  Other Topics Concern  . Not on file  Social History Narrative  . Not on file   Social Determinants of Health   Financial Resource Strain:   . Difficulty of Paying Living Expenses:   Food Insecurity:   . Worried About Programme researcher, broadcasting/film/video in the Last Year:   . Barista in the Last Year:   Transportation Needs:   . Freight forwarder (Medical):   Marland Kitchen  Lack of Transportation (Non-Medical):   Physical Activity:   . Days of Exercise per Week:   . Minutes of Exercise per Session:   Stress:   . Feeling of Stress :   Social Connections:   . Frequency of Communication with Friends and Family:   . Frequency of Social Gatherings with Friends and Family:   . Attends Religious Services:   . Active Member of Clubs or Organizations:   . Attends Banker Meetings:   Marland Kitchen Marital Status:   Intimate Partner Violence:   . Fear of Current or Ex-Partner:   . Emotionally Abused:   Marland Kitchen Physically Abused:   . Sexually Abused:     Review of Systems    Constitutional: Negative.  Negative for chills and fever.  HENT: Negative.  Negative for congestion and sore throat.   Respiratory: Negative.  Negative for cough and shortness of breath.   Cardiovascular: Negative.  Negative for chest pain and palpitations.  Gastrointestinal: Negative for abdominal pain, diarrhea, nausea and vomiting.  Genitourinary: Negative for hematuria.  Skin: Negative.   Neurological: Negative for dizziness and headaches.  All other systems reviewed and are negative.   Objective  Alert and oriented x3 no apparent respiratory distress. Vitals as reported by the patient: Today's Vitals   12/06/19 1324  Weight: 115 lb (52.2 kg)  Height: 5\' 4"  (1.626 m)    Bianca [name] was seen today for medication refill.  Diagnoses and all orders for this visit:  Recurrent herpes simplex -     valACYclovir (VALTREX) 1000 MG tablet; TAKE 1 TABLET(1000 MG) BY MOUTH DAILY  History of herpes zoster      I discussed the assessment and treatment plan with the patient. The patient was provided an opportunity to ask questions and all were answered. The patient agreed with the plan and demonstrated an understanding of the instructions.   The patient was advised to call back or seek an in-person evaluation if the symptoms worsen or if the condition fails to improve as anticipated.  I provided 10 minutes of non-face-to-face time during this encounter.  Darl Pikes, MD  Primary Care at [facility]

## 2019-12-07 DIAGNOSIS — Z1231 Encounter for screening mammogram for malignant neoplasm of breast: Secondary | ICD-10-CM | POA: Diagnosis not present

## 2019-12-17 ENCOUNTER — Encounter: Payer: Self-pay | Admitting: Emergency Medicine

## 2019-12-17 NOTE — Telephone Encounter (Signed)
Not sure who ordered this mammogram in the first place. Last time I saw her in the office was 2 years ago. Cannot find the report to review what it says.  Please track it down.  She may want to call [facility] and voice her concerns to them.  Thanks.

## 2019-12-18 ENCOUNTER — Telehealth: Payer: Self-pay | Admitting: *Deleted

## 2019-12-18 NOTE — Telephone Encounter (Signed)
Faxed signed order to [facility] because screening mammgram was inconclusive. Confirmation page 8:26 am.

## 2020-01-03 DIAGNOSIS — R921 Mammographic calcification found on diagnostic imaging of breast: Secondary | ICD-10-CM | POA: Diagnosis not present

## 2020-01-22 ENCOUNTER — Telehealth: Payer: Self-pay | Admitting: *Deleted

## 2020-01-22 NOTE — Telephone Encounter (Signed)
Left message in voice mail to return call concerning mammogram/biopsy.

## 2020-01-23 ENCOUNTER — Telehealth: Payer: Self-pay | Admitting: *Deleted

## 2020-01-23 NOTE — Telephone Encounter (Signed)
Called patient left message in voice mail to call back if  the biopsy of lesion in her breast was done on 01/16/2020.

## 2020-01-28 ENCOUNTER — Telehealth: Payer: Self-pay | Admitting: *Deleted

## 2020-01-28 ENCOUNTER — Encounter: Payer: BC Managed Care – PPO | Admitting: Emergency Medicine

## 2020-01-28 NOTE — Telephone Encounter (Signed)
Spoke to patient concerning if she had the biopsy of lesion in her breast. Patient stated she spoke to Dr Juanetta Gosling this already about the biopsy. She will contact the doctor to schedule at a later time, she states it was calcification in her breast. Also, patient cancelled appt for this Thursday for CPE.

## 2020-01-31 ENCOUNTER — Encounter: Payer: BC Managed Care – PPO | Admitting: Emergency Medicine

## 2020-01-31 DIAGNOSIS — L814 Other melanin hyperpigmentation: Secondary | ICD-10-CM | POA: Diagnosis not present

## 2020-01-31 DIAGNOSIS — D2271 Melanocytic nevi of right lower limb, including hip: Secondary | ICD-10-CM | POA: Diagnosis not present

## 2020-01-31 DIAGNOSIS — D2262 Melanocytic nevi of left upper limb, including shoulder: Secondary | ICD-10-CM | POA: Diagnosis not present

## 2020-01-31 DIAGNOSIS — D225 Melanocytic nevi of trunk: Secondary | ICD-10-CM | POA: Diagnosis not present

## 2020-02-08 ENCOUNTER — Encounter: Payer: Self-pay | Admitting: Emergency Medicine

## 2020-02-14 ENCOUNTER — Encounter: Payer: Self-pay | Admitting: Emergency Medicine

## 2020-02-14 ENCOUNTER — Telehealth: Payer: Self-pay | Admitting: Emergency Medicine

## 2020-02-14 NOTE — Telephone Encounter (Signed)
Once we get the order, we will sign.  Thanks.

## 2020-02-14 NOTE — Telephone Encounter (Signed)
[facility] calling needing orders from pts provider. Pt has sent a Mychart message regarding this matter. Please advise The radiologist number: [phone]

## 2020-02-14 NOTE — Telephone Encounter (Signed)
Phone message from radiology for these orders do you need more information?

## 2020-02-14 NOTE — Telephone Encounter (Signed)
Patient was last seen by you on 12/06/19 by video. Patients last mammo ordered by you on 08/28/02 and she have been getting routine mammogram thereafter. Patient went in for her routine mammogram and now want Tammy Hoyle with [facility] will be contacting you for an order for an outside consultation with stereotactic biopsy (plus or minus additional imaging) for my left breast calcifications found in the mammogram with [facility].  Patient need an order from [place] to have [facility] to read the [facility] order and consult. I spoke to Tammy at [facility] and they will be faxing the order they need and just need you to sign for them to read and consult the patient. They will be faxing this order today

## 2020-02-14 NOTE — Telephone Encounter (Signed)
Not sure what these orders are.  Need to find out details.

## 2020-02-19 DIAGNOSIS — R921 Mammographic calcification found on diagnostic imaging of breast: Secondary | ICD-10-CM | POA: Diagnosis not present

## 2020-02-19 DIAGNOSIS — R5383 Other fatigue: Secondary | ICD-10-CM | POA: Diagnosis not present

## 2020-02-19 DIAGNOSIS — N952 Postmenopausal atrophic vaginitis: Secondary | ICD-10-CM | POA: Diagnosis not present

## 2020-02-19 DIAGNOSIS — N959 Unspecified menopausal and perimenopausal disorder: Secondary | ICD-10-CM | POA: Diagnosis not present

## 2020-02-22 DIAGNOSIS — Z77120 Contact with and (suspected) exposure to mold (toxic): Secondary | ICD-10-CM | POA: Diagnosis not present

## 2020-02-22 DIAGNOSIS — Z77018 Contact with and (suspected) exposure to other hazardous metals: Secondary | ICD-10-CM | POA: Diagnosis not present

## 2020-02-22 DIAGNOSIS — R5383 Other fatigue: Secondary | ICD-10-CM | POA: Diagnosis not present

## 2020-03-17 ENCOUNTER — Encounter: Payer: Self-pay | Admitting: Emergency Medicine

## 2020-03-17 DIAGNOSIS — R921 Mammographic calcification found on diagnostic imaging of breast: Secondary | ICD-10-CM | POA: Diagnosis not present

## 2020-03-17 DIAGNOSIS — N63 Unspecified lump in unspecified breast: Secondary | ICD-10-CM | POA: Diagnosis not present

## 2020-03-17 DIAGNOSIS — R920 Mammographic microcalcification found on diagnostic imaging of breast: Secondary | ICD-10-CM | POA: Diagnosis not present

## 2020-03-24 NOTE — Telephone Encounter (Signed)
Spoke to [employer]), she received form [facility]) and Dr Alvy Bimler signed it was faxed.

## 2020-05-22 DIAGNOSIS — N6489 Other specified disorders of breast: Secondary | ICD-10-CM | POA: Diagnosis not present

## 2020-05-22 DIAGNOSIS — D0512 Intraductal carcinoma in situ of left breast: Secondary | ICD-10-CM | POA: Diagnosis not present

## 2020-05-22 DIAGNOSIS — C50919 Malignant neoplasm of unspecified site of unspecified female breast: Secondary | ICD-10-CM | POA: Diagnosis not present

## 2020-05-22 DIAGNOSIS — Z171 Estrogen receptor negative status [ER-]: Secondary | ICD-10-CM | POA: Diagnosis not present

## 2020-05-26 DIAGNOSIS — Z23 Encounter for immunization: Secondary | ICD-10-CM | POA: Diagnosis not present

## 2020-05-26 DIAGNOSIS — R920 Mammographic microcalcification found on diagnostic imaging of breast: Secondary | ICD-10-CM | POA: Diagnosis not present

## 2020-05-26 DIAGNOSIS — D0512 Intraductal carcinoma in situ of left breast: Secondary | ICD-10-CM | POA: Diagnosis not present

## 2020-05-26 DIAGNOSIS — N6489 Other specified disorders of breast: Secondary | ICD-10-CM | POA: Diagnosis not present

## 2020-07-15 DIAGNOSIS — Z8042 Family history of malignant neoplasm of prostate: Secondary | ICD-10-CM | POA: Diagnosis not present

## 2020-07-15 DIAGNOSIS — C439 Malignant melanoma of skin, unspecified: Secondary | ICD-10-CM | POA: Diagnosis not present

## 2020-07-15 DIAGNOSIS — D0512 Intraductal carcinoma in situ of left breast: Secondary | ICD-10-CM | POA: Diagnosis not present

## 2020-07-17 DIAGNOSIS — D0512 Intraductal carcinoma in situ of left breast: Secondary | ICD-10-CM | POA: Diagnosis not present

## 2020-07-23 DIAGNOSIS — D0512 Intraductal carcinoma in situ of left breast: Secondary | ICD-10-CM | POA: Diagnosis not present

## 2020-07-23 DIAGNOSIS — R928 Other abnormal and inconclusive findings on diagnostic imaging of breast: Secondary | ICD-10-CM | POA: Diagnosis not present

## 2020-07-24 ENCOUNTER — Encounter: Payer: Self-pay | Admitting: Emergency Medicine

## 2020-07-24 ENCOUNTER — Other Ambulatory Visit: Payer: Self-pay

## 2020-07-24 ENCOUNTER — Telehealth: Payer: Self-pay | Admitting: Emergency Medicine

## 2020-07-24 ENCOUNTER — Ambulatory Visit (INDEPENDENT_AMBULATORY_CARE_PROVIDER_SITE_OTHER): Payer: BC Managed Care – PPO | Admitting: Emergency Medicine

## 2020-07-24 VITALS — BP 118/77 | HR 64 | Temp 97.4°F | Resp 16 | Ht 64.0 in | Wt 116.0 lb

## 2020-07-24 DIAGNOSIS — Z01818 Encounter for other preprocedural examination: Secondary | ICD-10-CM | POA: Diagnosis not present

## 2020-07-24 DIAGNOSIS — B009 Herpesviral infection, unspecified: Secondary | ICD-10-CM | POA: Diagnosis not present

## 2020-07-24 LAB — COMPREHENSIVE METABOLIC PANEL
ALT: 19 IU/L (ref 0–32)
AST: 21 IU/L (ref 0–40)
Albumin/Globulin Ratio: 2.8 — ABNORMAL HIGH (ref 1.2–2.2)
Albumin: 4.8 g/dL (ref 3.8–4.9)
Alkaline Phosphatase: 79 IU/L (ref 44–121)
BUN/Creatinine Ratio: 15 (ref 9–23)
BUN: 10 mg/dL (ref 6–24)
Bilirubin Total: 0.3 mg/dL (ref 0.0–1.2)
CO2: 23 mmol/L (ref 20–29)
Calcium: 9.2 mg/dL (ref 8.7–10.2)
Chloride: 97 mmol/L (ref 96–106)
Creatinine, Ser: 0.66 mg/dL (ref 0.57–1.00)
GFR calc Af Amer: 113 mL/min/{1.73_m2} (ref >59)
GFR calc non Af Amer: 98 mL/min/{1.73_m2} (ref >59)
Globulin, Total: 1.7 g/dL (ref 1.5–4.5)
Glucose: 85 mg/dL (ref 65–99)
Potassium: 4.4 mmol/L (ref 3.5–5.2)
Sodium: 135 mmol/L (ref 134–144)
Total Protein: 6.5 g/dL (ref 6.0–8.5)

## 2020-07-24 LAB — LIPID PANEL
Chol/HDL Ratio: 2.4 ratio (ref 0.0–4.4)
Cholesterol, Total: 194 mg/dL (ref 100–199)
HDL: 82 mg/dL (ref >39)
LDL Chol Calc (NIH): 102 mg/dL — ABNORMAL HIGH (ref 0–99)
Triglycerides: 50 mg/dL (ref 0–149)
VLDL Cholesterol Cal: 10 mg/dL (ref 5–40)

## 2020-07-24 LAB — CBC WITH DIFFERENTIAL/PLATELET
Basophils Absolute: 0.1 10*3/uL (ref 0.0–0.2)
Basos: 1 %
EOS (ABSOLUTE): 0.1 10*3/uL (ref 0.0–0.4)
Eos: 1 %
Hematocrit: 37.4 % (ref 34.0–46.6)
Hemoglobin: 12.8 g/dL (ref 11.1–15.9)
Immature Grans (Abs): 0 10*3/uL (ref 0.0–0.1)
Immature Granulocytes: 0 %
Lymphocytes Absolute: 1.6 10*3/uL (ref 0.7–3.1)
Lymphs: 33 %
MCH: 32.7 pg (ref 26.6–33.0)
MCHC: 34.2 g/dL (ref 31.5–35.7)
MCV: 95 fL (ref 79–97)
Monocytes Absolute: 0.4 10*3/uL (ref 0.1–0.9)
Monocytes: 8 %
Neutrophils Absolute: 2.8 10*3/uL (ref 1.4–7.0)
Neutrophils: 57 %
Platelets: 214 10*3/uL (ref 150–450)
RBC: 3.92 x10E6/uL (ref 3.77–5.28)
RDW: 12.5 % (ref 11.7–15.4)
WBC: 5 10*3/uL (ref 3.4–10.8)

## 2020-07-24 LAB — HEMOGLOBIN A1C
Est. average glucose Bld gHb Est-mCnc: 103 mg/dL
Hgb A1c MFr Bld: 5.2 % (ref 4.8–5.6)

## 2020-07-24 MED ORDER — VALACYCLOVIR HCL 1 G PO TABS: ORAL_TABLET | ORAL | 3 refills | Status: DC

## 2020-07-24 NOTE — Progress Notes (Addendum)
Subjective:    Bianca [name] is a 60 y.o. female who presents to the office today for a preoperative consultation at the request of surgeon  who plans on performing bilateral mastectomy on March 11. Planned anesthesia: general. The patient has the following known anesthesia issues: none. Patients bleeding risk: no recent abnormal bleeding. Patient does not have objections to receiving blood products if needed.  The following portions of the patient's history were reviewed and updated as appropriate: allergies, current medications, past family history, past medical history, past social history, past surgical history and problem list.  Review of Systems A comprehensive review of systems was negative.    Objective:    BP 118/77   Pulse 64   Temp (!) 97.4 F (36.3 C) (Temporal)   Resp 16   Ht 5\' 4"  (1.626 m)   Wt 116 lb (52.6 kg)   SpO2 97%   BMI 19.91 kg/m   General Appearance:    Alert, cooperative, no distress, appears stated age  Head:    Normocephalic, without obvious abnormality, atraumatic  Eyes:    PERRL, conjunctiva/corneas clear, EOM's intact      Nose:   Nares normal, septum midline, mucosa normal, no drainage    or sinus tenderness  Throat:   Lips, mucosa, and tongue normal; teeth and gums normal  Neck:   Supple, symmetrical, trachea midline, no adenopathy;    thyroid:  no enlargement/tenderness/nodules; no carotid   bruit or JVD  Back:     Symmetric, no curvature, ROM normal, no CVA tenderness  Lungs:     Clear to auscultation bilaterally, respirations unlabored  Chest Wall:    No tenderness or deformity   Heart:    Regular rate and rhythm, S1 and S2 normal, no murmur, rub   or gallop        Abdomen:     Soft, non-tender, bowel sounds active all four quadrants,    no masses, no organomegaly          Extremities:   Extremities normal, atraumatic, no cyanosis or edema  Pulses:   2+ and symmetric all extremities  Skin:   Skin color, texture, turgor normal, no rashes  or lesions  Lymph nodes:  Left submandibular adenopathy  Neurologic:   CNII-XII intact, normal strength, sensation and reflexes    throughout    Predictors of intubation difficulty:  Morbid obesity? no  Anatomically abnormal facies? no  Prominent incisors? no  Receding mandible? no  Short, thick neck? no  Neck range of motion: normal    Cardiographics ECG: normal sinus rhythm, no blocks or conduction defects, no ischemic changes  Lab Review  Normal labs   Assessment:      60 y.o. female with planned surgery as above.   Known risk factors for perioperative complications: None   Difficulty with intubation is not anticipated.  Cardiac Risk Estimation: low  Current medications which may produce withdrawal symptoms if withheld perioperatively: none     Plan:    1. Preoperative workup as follows ECG, hemoglobin, hematocrit, electrolytes, creatinine, glucose, liver function studies. 2. Change in medication regimen before surgery: none, continue medication regimen including morning of surgery, with sip of water. 3. Prophylaxis for cardiac events with perioperative beta-blockers: not indicated. 4. Invasive hemodynamic monitoring perioperatively: at the discretion of anesthesiologist. 5. Deep vein thrombosis prophylaxis postoperatively:regimen to be chosen by surgical team. 6. Surveillance for postoperative MI with ECG immediately postoperatively and on postoperative days 1 and 2 AND troponin levels 24  hours postoperatively and on day 4 or hospital discharge (whichever comes first): at the discretion of anesthesiologist. 7. Other measures: none

## 2020-07-24 NOTE — Telephone Encounter (Signed)
Received call from [facility]. Stated additional paperwork needed for surgical clearance. Also asked for [place] to fax patient's EKG and blood work to [phone]  Please advise at [phone], ext [phone].

## 2020-07-24 NOTE — Patient Instructions (Addendum)
   If you have lab work done today you will be contacted with your lab results within the next 2 weeks.  If you have not heard from us then please contact us. The fastest way to get your results is to register for My Chart.   IF you received an x-ray today, you will receive an invoice from [facility]. Please contact [facility] at [phone] with questions or concerns regarding your invoice.   IF you received labwork today, you will receive an invoice from [facility]. Please contact [facility] at [phone] with questions or concerns regarding your invoice.   Our billing staff will not be able to assist you with questions regarding bills from these companies.  You will be contacted with the lab results as soon as they are available. The fastest way to get your results is to activate your My Chart account. Instructions are located on the last page of this paperwork. If you have not heard from us regarding the results in 2 weeks, please contact this office.     Health Maintenance, Female Adopting a healthy lifestyle and getting preventive care are important in promoting health and wellness. Ask your health care provider about:  The right schedule for you to have regular tests and exams.  Things you can do on your own to prevent diseases and keep yourself healthy. What should I know about diet, weight, and exercise? Eat a healthy diet  Eat a diet that includes plenty of vegetables, fruits, low-fat dairy products, and lean protein.  Do not eat a lot of foods that are high in solid fats, added sugars, or sodium.   Maintain a healthy weight Body mass index (BMI) is used to identify weight problems. It estimates body fat based on height and weight. Your health care provider can help determine your BMI and help you achieve or maintain a healthy weight. Get regular exercise Get regular exercise. This is one of the most important things you can do for your health. Most  adults should:  Exercise for at least 150 minutes each week. The exercise should increase your heart rate and make you sweat (moderate-intensity exercise).  Do strengthening exercises at least twice a week. This is in addition to the moderate-intensity exercise.  Spend less time sitting. Even light physical activity can be beneficial. Watch cholesterol and blood lipids Have your blood tested for lipids and cholesterol at 58 years of age, then have this test every 5 years. Have your cholesterol levels checked more often if:  Your lipid or cholesterol levels are high.  You are older than 58 years of age.  You are at high risk for heart disease. What should I know about cancer screening? Depending on your health history and family history, you may need to have cancer screening at various ages. This may include screening for:  Breast cancer.  Cervical cancer.  Colorectal cancer.  Skin cancer.  Lung cancer. What should I know about heart disease, diabetes, and high blood pressure? Blood pressure and heart disease  High blood pressure causes heart disease and increases the risk of stroke. This is more likely to develop in people who have high blood pressure readings, are of African descent, or are overweight.  Have your blood pressure checked: ? Every 3-5 years if you are 18-39 years of age. ? Every year if you are 40 years old or older. Diabetes Have regular diabetes screenings. This checks your fasting blood sugar level. Have the screening done:  Once every   three years after age 40 if you are at a normal weight and have a low risk for diabetes.  More often and at a younger age if you are overweight or have a high risk for diabetes. What should I know about preventing infection? Hepatitis B If you have a higher risk for hepatitis B, you should be screened for this virus. Talk with your health care provider to find out if you are at risk for hepatitis B infection. Hepatitis  C Testing is recommended for:  Everyone born from 1945 through 1965.  Anyone with known risk factors for hepatitis C. Sexually transmitted infections (STIs)  Get screened for STIs, including gonorrhea and chlamydia, if: ? You are sexually active and are younger than 58 years of age. ? You are older than 58 years of age and your health care provider tells you that you are at risk for this type of infection. ? Your sexual activity has changed since you were last screened, and you are at increased risk for chlamydia or gonorrhea. Ask your health care provider if you are at risk.  Ask your health care provider about whether you are at high risk for HIV. Your health care provider may recommend a prescription medicine to help prevent HIV infection. If you choose to take medicine to prevent HIV, you should first get tested for HIV. You should then be tested every 3 months for as long as you are taking the medicine. Pregnancy  If you are about to stop having your period (premenopausal) and you may become pregnant, seek counseling before you get pregnant.  Take 400 to 800 micrograms (mcg) of folic acid every day if you become pregnant.  Ask for birth control (contraception) if you want to prevent pregnancy. Osteoporosis and menopause Osteoporosis is a disease in which the bones lose minerals and strength with aging. This can result in bone fractures. If you are 65 years old or older, or if you are at risk for osteoporosis and fractures, ask your health care provider if you should:  Be screened for bone loss.  Take a calcium or vitamin D supplement to lower your risk of fractures.  Be given hormone replacement therapy (HRT) to treat symptoms of menopause. Follow these instructions at home: Lifestyle  Do not use any products that contain nicotine or tobacco, such as cigarettes, e-cigarettes, and chewing tobacco. If you need help quitting, ask your health care provider.  Do not use street  drugs.  Do not share needles.  Ask your health care provider for help if you need support or information about quitting drugs. Alcohol use  Do not drink alcohol if: ? Your health care provider tells you not to drink. ? You are pregnant, may be pregnant, or are planning to become pregnant.  If you drink alcohol: ? Limit how much you use to 0-1 drink a day. ? Limit intake if you are breastfeeding.  Be aware of how much alcohol is in your drink. In the [place] one drink equals one 12 oz bottle of beer (355 mL), one 5 oz glass of wine (148 mL), or one 1 oz glass of hard liquor (44 mL). General instructions  Schedule regular health, dental, and eye exams.  Stay current with your vaccines.  Tell your health care provider if: ? You often feel depressed. ? You have ever been abused or do not feel safe at home. Summary  Adopting a healthy lifestyle and getting preventive care are important in promoting health and wellness.    Follow your health care provider's instructions about healthy diet, exercising, and getting tested or screened for diseases.  Follow your health care provider's instructions on monitoring your cholesterol and blood pressure. This information is not intended to replace advice given to you by your health care provider. Make sure you discuss any questions you have with your health care provider. Document Revised: 05/17/2018 Document Reviewed: 05/17/2018 Elsevier Patient Education  2021 [employer]  

## 2020-07-25 DIAGNOSIS — D0512 Intraductal carcinoma in situ of left breast: Secondary | ICD-10-CM | POA: Diagnosis not present

## 2020-07-25 DIAGNOSIS — Z7183 Encounter for nonprocreative genetic counseling: Secondary | ICD-10-CM | POA: Diagnosis not present

## 2020-07-25 DIAGNOSIS — Z803 Family history of malignant neoplasm of breast: Secondary | ICD-10-CM | POA: Diagnosis not present

## 2020-07-25 DIAGNOSIS — Z8042 Family history of malignant neoplasm of prostate: Secondary | ICD-10-CM | POA: Diagnosis not present

## 2020-07-28 NOTE — Telephone Encounter (Signed)
Attempted to call Belma back no answer.   I have faxed over the information to the fax Number provided.

## 2020-07-31 DIAGNOSIS — D0512 Intraductal carcinoma in situ of left breast: Secondary | ICD-10-CM | POA: Diagnosis not present

## 2020-08-04 DIAGNOSIS — Z803 Family history of malignant neoplasm of breast: Secondary | ICD-10-CM | POA: Diagnosis not present

## 2020-08-04 DIAGNOSIS — D0512 Intraductal carcinoma in situ of left breast: Secondary | ICD-10-CM | POA: Diagnosis not present

## 2020-08-04 DIAGNOSIS — Z808 Family history of malignant neoplasm of other organs or systems: Secondary | ICD-10-CM | POA: Diagnosis not present

## 2020-08-04 DIAGNOSIS — Z8042 Family history of malignant neoplasm of prostate: Secondary | ICD-10-CM | POA: Diagnosis not present

## 2020-08-12 DIAGNOSIS — Z20822 Contact with and (suspected) exposure to covid-19: Secondary | ICD-10-CM | POA: Diagnosis not present

## 2020-08-12 DIAGNOSIS — Z9011 Acquired absence of right breast and nipple: Secondary | ICD-10-CM | POA: Diagnosis not present

## 2020-08-12 DIAGNOSIS — C50912 Malignant neoplasm of unspecified site of left female breast: Secondary | ICD-10-CM | POA: Diagnosis not present

## 2020-08-12 DIAGNOSIS — Z01812 Encounter for preprocedural laboratory examination: Secondary | ICD-10-CM | POA: Diagnosis not present

## 2020-08-15 DIAGNOSIS — R921 Mammographic calcification found on diagnostic imaging of breast: Secondary | ICD-10-CM | POA: Diagnosis not present

## 2020-08-15 DIAGNOSIS — D360 Benign neoplasm of lymph nodes: Secondary | ICD-10-CM | POA: Diagnosis not present

## 2020-08-15 DIAGNOSIS — Z170 Estrogen receptor positive status [ER+]: Secondary | ICD-10-CM | POA: Diagnosis not present

## 2020-08-15 DIAGNOSIS — N959 Unspecified menopausal and perimenopausal disorder: Secondary | ICD-10-CM | POA: Diagnosis not present

## 2020-08-15 DIAGNOSIS — Z853 Personal history of malignant neoplasm of breast: Secondary | ICD-10-CM | POA: Diagnosis not present

## 2020-08-15 DIAGNOSIS — Z880 Allergy status to penicillin: Secondary | ICD-10-CM | POA: Diagnosis not present

## 2020-08-15 DIAGNOSIS — D241 Benign neoplasm of right breast: Secondary | ICD-10-CM | POA: Diagnosis not present

## 2020-08-15 DIAGNOSIS — Z807 Family history of other malignant neoplasms of lymphoid, hematopoietic and related tissues: Secondary | ICD-10-CM | POA: Diagnosis not present

## 2020-08-15 DIAGNOSIS — R590 Localized enlarged lymph nodes: Secondary | ICD-10-CM | POA: Diagnosis not present

## 2020-08-15 DIAGNOSIS — Z79899 Other long term (current) drug therapy: Secondary | ICD-10-CM | POA: Diagnosis not present

## 2020-08-15 DIAGNOSIS — D0512 Intraductal carcinoma in situ of left breast: Secondary | ICD-10-CM | POA: Diagnosis not present

## 2020-08-15 DIAGNOSIS — C50512 Malignant neoplasm of lower-outer quadrant of left female breast: Secondary | ICD-10-CM | POA: Diagnosis not present

## 2020-08-15 DIAGNOSIS — Z808 Family history of malignant neoplasm of other organs or systems: Secondary | ICD-10-CM | POA: Diagnosis not present

## 2020-08-15 DIAGNOSIS — C50912 Malignant neoplasm of unspecified site of left female breast: Secondary | ICD-10-CM | POA: Diagnosis not present

## 2020-08-15 DIAGNOSIS — Z8042 Family history of malignant neoplasm of prostate: Secondary | ICD-10-CM | POA: Diagnosis not present

## 2020-08-16 DIAGNOSIS — Z79899 Other long term (current) drug therapy: Secondary | ICD-10-CM | POA: Diagnosis not present

## 2020-08-16 DIAGNOSIS — C50912 Malignant neoplasm of unspecified site of left female breast: Secondary | ICD-10-CM | POA: Diagnosis not present

## 2020-08-16 DIAGNOSIS — Z880 Allergy status to penicillin: Secondary | ICD-10-CM | POA: Diagnosis not present

## 2020-08-16 DIAGNOSIS — Z8042 Family history of malignant neoplasm of prostate: Secondary | ICD-10-CM | POA: Diagnosis not present

## 2020-08-16 DIAGNOSIS — Z807 Family history of other malignant neoplasms of lymphoid, hematopoietic and related tissues: Secondary | ICD-10-CM | POA: Diagnosis not present

## 2020-08-16 DIAGNOSIS — N959 Unspecified menopausal and perimenopausal disorder: Secondary | ICD-10-CM | POA: Diagnosis not present

## 2020-08-16 DIAGNOSIS — Z808 Family history of malignant neoplasm of other organs or systems: Secondary | ICD-10-CM | POA: Diagnosis not present

## 2020-08-26 DIAGNOSIS — Z170 Estrogen receptor positive status [ER+]: Secondary | ICD-10-CM | POA: Diagnosis not present

## 2020-08-26 DIAGNOSIS — Z8049 Family history of malignant neoplasm of other genital organs: Secondary | ICD-10-CM | POA: Diagnosis not present

## 2020-08-26 DIAGNOSIS — Z803 Family history of malignant neoplasm of breast: Secondary | ICD-10-CM | POA: Diagnosis not present

## 2020-08-26 DIAGNOSIS — C50912 Malignant neoplasm of unspecified site of left female breast: Secondary | ICD-10-CM | POA: Diagnosis not present

## 2020-08-26 DIAGNOSIS — D241 Benign neoplasm of right breast: Secondary | ICD-10-CM | POA: Diagnosis not present

## 2020-08-26 DIAGNOSIS — Z8042 Family history of malignant neoplasm of prostate: Secondary | ICD-10-CM | POA: Diagnosis not present

## 2020-08-26 DIAGNOSIS — D0512 Intraductal carcinoma in situ of left breast: Secondary | ICD-10-CM | POA: Diagnosis not present

## 2020-08-26 DIAGNOSIS — Z9011 Acquired absence of right breast and nipple: Secondary | ICD-10-CM | POA: Diagnosis not present

## 2020-08-26 DIAGNOSIS — Z8241 Family history of sudden cardiac death: Secondary | ICD-10-CM | POA: Diagnosis not present

## 2020-08-26 DIAGNOSIS — Z809 Family history of malignant neoplasm, unspecified: Secondary | ICD-10-CM | POA: Diagnosis not present

## 2020-08-26 DIAGNOSIS — C50812 Malignant neoplasm of overlapping sites of left female breast: Secondary | ICD-10-CM | POA: Diagnosis not present

## 2020-09-01 DIAGNOSIS — R590 Localized enlarged lymph nodes: Secondary | ICD-10-CM | POA: Diagnosis not present

## 2020-09-02 DIAGNOSIS — M25611 Stiffness of right shoulder, not elsewhere classified: Secondary | ICD-10-CM | POA: Diagnosis not present

## 2020-09-02 DIAGNOSIS — M25612 Stiffness of left shoulder, not elsewhere classified: Secondary | ICD-10-CM | POA: Diagnosis not present

## 2020-09-02 DIAGNOSIS — Z9889 Other specified postprocedural states: Secondary | ICD-10-CM | POA: Diagnosis not present

## 2020-09-02 DIAGNOSIS — C50912 Malignant neoplasm of unspecified site of left female breast: Secondary | ICD-10-CM | POA: Diagnosis not present

## 2020-09-04 DIAGNOSIS — Z9889 Other specified postprocedural states: Secondary | ICD-10-CM | POA: Diagnosis not present

## 2020-09-04 DIAGNOSIS — C50912 Malignant neoplasm of unspecified site of left female breast: Secondary | ICD-10-CM | POA: Diagnosis not present

## 2020-09-04 DIAGNOSIS — M25611 Stiffness of right shoulder, not elsewhere classified: Secondary | ICD-10-CM | POA: Diagnosis not present

## 2020-09-04 DIAGNOSIS — M25612 Stiffness of left shoulder, not elsewhere classified: Secondary | ICD-10-CM | POA: Diagnosis not present

## 2020-09-08 DIAGNOSIS — C50812 Malignant neoplasm of overlapping sites of left female breast: Secondary | ICD-10-CM | POA: Diagnosis not present

## 2020-09-08 DIAGNOSIS — M810 Age-related osteoporosis without current pathological fracture: Secondary | ICD-10-CM | POA: Diagnosis not present

## 2020-09-09 DIAGNOSIS — Z9011 Acquired absence of right breast and nipple: Secondary | ICD-10-CM | POA: Diagnosis not present

## 2020-09-09 DIAGNOSIS — M25612 Stiffness of left shoulder, not elsewhere classified: Secondary | ICD-10-CM | POA: Diagnosis not present

## 2020-09-09 DIAGNOSIS — C50912 Malignant neoplasm of unspecified site of left female breast: Secondary | ICD-10-CM | POA: Diagnosis not present

## 2020-09-09 DIAGNOSIS — M25611 Stiffness of right shoulder, not elsewhere classified: Secondary | ICD-10-CM | POA: Diagnosis not present

## 2020-09-11 DIAGNOSIS — C50912 Malignant neoplasm of unspecified site of left female breast: Secondary | ICD-10-CM | POA: Diagnosis not present

## 2020-09-11 DIAGNOSIS — M25611 Stiffness of right shoulder, not elsewhere classified: Secondary | ICD-10-CM | POA: Diagnosis not present

## 2020-09-11 DIAGNOSIS — M25612 Stiffness of left shoulder, not elsewhere classified: Secondary | ICD-10-CM | POA: Diagnosis not present

## 2020-09-11 DIAGNOSIS — Z9011 Acquired absence of right breast and nipple: Secondary | ICD-10-CM | POA: Diagnosis not present

## 2020-09-12 DIAGNOSIS — C50812 Malignant neoplasm of overlapping sites of left female breast: Secondary | ICD-10-CM | POA: Diagnosis not present

## 2020-09-12 DIAGNOSIS — Z170 Estrogen receptor positive status [ER+]: Secondary | ICD-10-CM | POA: Diagnosis not present

## 2020-09-12 DIAGNOSIS — M810 Age-related osteoporosis without current pathological fracture: Secondary | ICD-10-CM | POA: Diagnosis not present

## 2020-09-15 DIAGNOSIS — C50912 Malignant neoplasm of unspecified site of left female breast: Secondary | ICD-10-CM | POA: Diagnosis not present

## 2020-09-15 DIAGNOSIS — M25611 Stiffness of right shoulder, not elsewhere classified: Secondary | ICD-10-CM | POA: Diagnosis not present

## 2020-09-15 DIAGNOSIS — M25612 Stiffness of left shoulder, not elsewhere classified: Secondary | ICD-10-CM | POA: Diagnosis not present

## 2020-09-15 DIAGNOSIS — Z9011 Acquired absence of right breast and nipple: Secondary | ICD-10-CM | POA: Diagnosis not present

## 2020-09-18 DIAGNOSIS — M25612 Stiffness of left shoulder, not elsewhere classified: Secondary | ICD-10-CM | POA: Diagnosis not present

## 2020-09-18 DIAGNOSIS — M25611 Stiffness of right shoulder, not elsewhere classified: Secondary | ICD-10-CM | POA: Diagnosis not present

## 2020-09-18 DIAGNOSIS — C50912 Malignant neoplasm of unspecified site of left female breast: Secondary | ICD-10-CM | POA: Diagnosis not present

## 2020-09-18 DIAGNOSIS — Z9011 Acquired absence of right breast and nipple: Secondary | ICD-10-CM | POA: Diagnosis not present

## 2020-09-23 DIAGNOSIS — M25612 Stiffness of left shoulder, not elsewhere classified: Secondary | ICD-10-CM | POA: Diagnosis not present

## 2020-09-23 DIAGNOSIS — C50912 Malignant neoplasm of unspecified site of left female breast: Secondary | ICD-10-CM | POA: Diagnosis not present

## 2020-09-23 DIAGNOSIS — M25611 Stiffness of right shoulder, not elsewhere classified: Secondary | ICD-10-CM | POA: Diagnosis not present

## 2020-09-23 DIAGNOSIS — Z9011 Acquired absence of right breast and nipple: Secondary | ICD-10-CM | POA: Diagnosis not present

## 2020-09-26 DIAGNOSIS — C50912 Malignant neoplasm of unspecified site of left female breast: Secondary | ICD-10-CM | POA: Diagnosis not present

## 2020-09-26 DIAGNOSIS — M25611 Stiffness of right shoulder, not elsewhere classified: Secondary | ICD-10-CM | POA: Diagnosis not present

## 2020-09-26 DIAGNOSIS — M25612 Stiffness of left shoulder, not elsewhere classified: Secondary | ICD-10-CM | POA: Diagnosis not present

## 2020-09-26 DIAGNOSIS — Z9011 Acquired absence of right breast and nipple: Secondary | ICD-10-CM | POA: Diagnosis not present

## 2020-09-29 DIAGNOSIS — M25611 Stiffness of right shoulder, not elsewhere classified: Secondary | ICD-10-CM | POA: Diagnosis not present

## 2020-09-29 DIAGNOSIS — M25612 Stiffness of left shoulder, not elsewhere classified: Secondary | ICD-10-CM | POA: Diagnosis not present

## 2020-09-29 DIAGNOSIS — Z9011 Acquired absence of right breast and nipple: Secondary | ICD-10-CM | POA: Diagnosis not present

## 2020-09-29 DIAGNOSIS — C50912 Malignant neoplasm of unspecified site of left female breast: Secondary | ICD-10-CM | POA: Diagnosis not present

## 2020-10-03 DIAGNOSIS — C50912 Malignant neoplasm of unspecified site of left female breast: Secondary | ICD-10-CM | POA: Diagnosis not present

## 2020-10-03 DIAGNOSIS — Z9011 Acquired absence of right breast and nipple: Secondary | ICD-10-CM | POA: Diagnosis not present

## 2020-10-03 DIAGNOSIS — M25612 Stiffness of left shoulder, not elsewhere classified: Secondary | ICD-10-CM | POA: Diagnosis not present

## 2020-10-03 DIAGNOSIS — M25611 Stiffness of right shoulder, not elsewhere classified: Secondary | ICD-10-CM | POA: Diagnosis not present

## 2020-10-10 DIAGNOSIS — Z8249 Family history of ischemic heart disease and other diseases of the circulatory system: Secondary | ICD-10-CM | POA: Diagnosis not present

## 2020-10-10 DIAGNOSIS — Z124 Encounter for screening for malignant neoplasm of cervix: Secondary | ICD-10-CM | POA: Diagnosis not present

## 2020-10-10 DIAGNOSIS — Z0000 Encounter for general adult medical examination without abnormal findings: Secondary | ICD-10-CM | POA: Diagnosis not present

## 2020-10-10 DIAGNOSIS — E559 Vitamin D deficiency, unspecified: Secondary | ICD-10-CM | POA: Diagnosis not present

## 2020-10-10 DIAGNOSIS — N819 Female genital prolapse, unspecified: Secondary | ICD-10-CM | POA: Diagnosis not present

## 2020-10-10 DIAGNOSIS — M810 Age-related osteoporosis without current pathological fracture: Secondary | ICD-10-CM | POA: Diagnosis not present

## 2020-10-22 DIAGNOSIS — Z9011 Acquired absence of right breast and nipple: Secondary | ICD-10-CM | POA: Diagnosis not present

## 2020-10-22 DIAGNOSIS — Z9889 Other specified postprocedural states: Secondary | ICD-10-CM | POA: Diagnosis not present

## 2020-10-22 DIAGNOSIS — C50912 Malignant neoplasm of unspecified site of left female breast: Secondary | ICD-10-CM | POA: Diagnosis not present

## 2020-10-22 DIAGNOSIS — Z9012 Acquired absence of left breast and nipple: Secondary | ICD-10-CM | POA: Diagnosis not present

## 2020-10-24 DIAGNOSIS — R509 Fever, unspecified: Secondary | ICD-10-CM | POA: Diagnosis not present

## 2020-10-24 DIAGNOSIS — Z20822 Contact with and (suspected) exposure to covid-19: Secondary | ICD-10-CM | POA: Diagnosis not present

## 2020-11-10 DIAGNOSIS — R9431 Abnormal electrocardiogram [ECG] [EKG]: Secondary | ICD-10-CM | POA: Diagnosis not present

## 2020-11-10 DIAGNOSIS — R002 Palpitations: Secondary | ICD-10-CM | POA: Diagnosis not present

## 2020-11-10 DIAGNOSIS — Z8249 Family history of ischemic heart disease and other diseases of the circulatory system: Secondary | ICD-10-CM | POA: Diagnosis not present

## 2020-11-14 DIAGNOSIS — H52223 Regular astigmatism, bilateral: Secondary | ICD-10-CM | POA: Diagnosis not present

## 2020-11-14 DIAGNOSIS — H53143 Visual discomfort, bilateral: Secondary | ICD-10-CM | POA: Diagnosis not present

## 2020-11-26 DIAGNOSIS — N952 Postmenopausal atrophic vaginitis: Secondary | ICD-10-CM | POA: Diagnosis not present

## 2020-11-26 DIAGNOSIS — Z124 Encounter for screening for malignant neoplasm of cervix: Secondary | ICD-10-CM | POA: Diagnosis not present

## 2020-12-05 DIAGNOSIS — Z8249 Family history of ischemic heart disease and other diseases of the circulatory system: Secondary | ICD-10-CM | POA: Diagnosis not present

## 2020-12-05 DIAGNOSIS — R9431 Abnormal electrocardiogram [ECG] [EKG]: Secondary | ICD-10-CM | POA: Diagnosis not present

## 2020-12-05 DIAGNOSIS — R002 Palpitations: Secondary | ICD-10-CM | POA: Diagnosis not present

## 2020-12-10 DIAGNOSIS — C50912 Malignant neoplasm of unspecified site of left female breast: Secondary | ICD-10-CM | POA: Diagnosis not present

## 2020-12-10 DIAGNOSIS — Z9889 Other specified postprocedural states: Secondary | ICD-10-CM | POA: Diagnosis not present

## 2020-12-10 DIAGNOSIS — Z8616 Personal history of COVID-19: Secondary | ICD-10-CM | POA: Diagnosis not present

## 2020-12-10 DIAGNOSIS — D0512 Intraductal carcinoma in situ of left breast: Secondary | ICD-10-CM | POA: Diagnosis not present

## 2020-12-10 DIAGNOSIS — Z9012 Acquired absence of left breast and nipple: Secondary | ICD-10-CM | POA: Diagnosis not present

## 2020-12-15 DIAGNOSIS — L905 Scar conditions and fibrosis of skin: Secondary | ICD-10-CM | POA: Diagnosis not present

## 2020-12-15 DIAGNOSIS — M1990 Unspecified osteoarthritis, unspecified site: Secondary | ICD-10-CM | POA: Diagnosis not present

## 2020-12-15 DIAGNOSIS — M810 Age-related osteoporosis without current pathological fracture: Secondary | ICD-10-CM | POA: Diagnosis not present

## 2020-12-15 DIAGNOSIS — E785 Hyperlipidemia, unspecified: Secondary | ICD-10-CM | POA: Diagnosis not present

## 2020-12-15 DIAGNOSIS — Z421 Encounter for breast reconstruction following mastectomy: Secondary | ICD-10-CM | POA: Diagnosis not present

## 2020-12-15 DIAGNOSIS — C50812 Malignant neoplasm of overlapping sites of left female breast: Secondary | ICD-10-CM | POA: Diagnosis not present

## 2020-12-15 DIAGNOSIS — Z8042 Family history of malignant neoplasm of prostate: Secondary | ICD-10-CM | POA: Diagnosis not present

## 2020-12-15 DIAGNOSIS — Z853 Personal history of malignant neoplasm of breast: Secondary | ICD-10-CM | POA: Diagnosis not present

## 2020-12-15 DIAGNOSIS — Z9013 Acquired absence of bilateral breasts and nipples: Secondary | ICD-10-CM | POA: Diagnosis not present

## 2020-12-15 DIAGNOSIS — Z808 Family history of malignant neoplasm of other organs or systems: Secondary | ICD-10-CM | POA: Diagnosis not present

## 2020-12-15 DIAGNOSIS — Z170 Estrogen receptor positive status [ER+]: Secondary | ICD-10-CM | POA: Diagnosis not present

## 2020-12-15 DIAGNOSIS — D0512 Intraductal carcinoma in situ of left breast: Secondary | ICD-10-CM | POA: Diagnosis not present

## 2020-12-15 DIAGNOSIS — Z79899 Other long term (current) drug therapy: Secondary | ICD-10-CM | POA: Diagnosis not present

## 2020-12-15 DIAGNOSIS — Z8249 Family history of ischemic heart disease and other diseases of the circulatory system: Secondary | ICD-10-CM | POA: Diagnosis not present

## 2021-01-12 DIAGNOSIS — M2021 Hallux rigidus, right foot: Secondary | ICD-10-CM | POA: Diagnosis not present

## 2021-01-29 DIAGNOSIS — B360 Pityriasis versicolor: Secondary | ICD-10-CM | POA: Diagnosis not present

## 2021-03-02 DIAGNOSIS — J301 Allergic rhinitis due to pollen: Secondary | ICD-10-CM | POA: Diagnosis not present

## 2021-03-02 DIAGNOSIS — L500 Allergic urticaria: Secondary | ICD-10-CM | POA: Diagnosis not present

## 2021-03-03 DIAGNOSIS — J301 Allergic rhinitis due to pollen: Secondary | ICD-10-CM | POA: Diagnosis not present

## 2021-03-03 DIAGNOSIS — L500 Allergic urticaria: Secondary | ICD-10-CM | POA: Diagnosis not present

## 2021-03-11 DIAGNOSIS — L439 Lichen planus, unspecified: Secondary | ICD-10-CM | POA: Diagnosis not present

## 2021-04-07 DIAGNOSIS — R002 Palpitations: Secondary | ICD-10-CM | POA: Diagnosis not present

## 2021-04-07 DIAGNOSIS — R0789 Other chest pain: Secondary | ICD-10-CM | POA: Diagnosis not present

## 2021-04-08 DIAGNOSIS — R0789 Other chest pain: Secondary | ICD-10-CM | POA: Diagnosis not present

## 2021-04-17 DIAGNOSIS — Z170 Estrogen receptor positive status [ER+]: Secondary | ICD-10-CM | POA: Diagnosis not present

## 2021-04-17 DIAGNOSIS — B009 Herpesviral infection, unspecified: Secondary | ICD-10-CM | POA: Diagnosis not present

## 2021-04-17 DIAGNOSIS — M810 Age-related osteoporosis without current pathological fracture: Secondary | ICD-10-CM | POA: Diagnosis not present

## 2021-04-17 DIAGNOSIS — Z1379 Encounter for other screening for genetic and chromosomal anomalies: Secondary | ICD-10-CM | POA: Diagnosis not present

## 2021-04-17 DIAGNOSIS — E559 Vitamin D deficiency, unspecified: Secondary | ICD-10-CM | POA: Diagnosis not present

## 2021-04-17 DIAGNOSIS — C50812 Malignant neoplasm of overlapping sites of left female breast: Secondary | ICD-10-CM | POA: Diagnosis not present

## 2021-04-17 DIAGNOSIS — D0512 Intraductal carcinoma in situ of left breast: Secondary | ICD-10-CM | POA: Diagnosis not present

## 2021-04-17 DIAGNOSIS — N951 Menopausal and female climacteric states: Secondary | ICD-10-CM | POA: Diagnosis not present

## 2021-05-05 DIAGNOSIS — R0789 Other chest pain: Secondary | ICD-10-CM | POA: Diagnosis not present

## 2021-08-03 ENCOUNTER — Other Ambulatory Visit: Payer: Self-pay | Admitting: Emergency Medicine

## 2021-08-03 DIAGNOSIS — B009 Herpesviral infection, unspecified: Secondary | ICD-10-CM

## 2022-09-01 ENCOUNTER — Other Ambulatory Visit: Payer: Self-pay | Admitting: Emergency Medicine

## 2022-09-01 DIAGNOSIS — B009 Herpesviral infection, unspecified: Secondary | ICD-10-CM
